# Patient Record
Sex: Female | Born: 1970 | Race: White | Hispanic: No | Marital: Single | Smoking: Current every day smoker
Health system: Southern US, Community
[De-identification: ages and names within clinical notes are randomized; demographics above are authoritative.]

## PROBLEM LIST (undated history)

## (undated) DIAGNOSIS — E271 Primary adrenocortical insufficiency: Secondary | ICD-10-CM

## (undated) DIAGNOSIS — E119 Type 2 diabetes mellitus without complications: Secondary | ICD-10-CM

## (undated) DIAGNOSIS — I10 Essential (primary) hypertension: Secondary | ICD-10-CM

## (undated) DIAGNOSIS — J45909 Unspecified asthma, uncomplicated: Secondary | ICD-10-CM

## (undated) HISTORY — PX: OTHER SURGICAL HISTORY: SHX169

## (undated) HISTORY — PX: BACK SURGERY: SHX140

---

## 1998-05-10 ENCOUNTER — Other Ambulatory Visit: Admission: RE | Admit: 1998-05-10 | Discharge: 1998-05-10 | Payer: Self-pay | Admitting: Obstetrics and Gynecology

## 1999-11-09 ENCOUNTER — Emergency Department (HOSPITAL_COMMUNITY): Admission: EM | Admit: 1999-11-09 | Discharge: 1999-11-09 | Payer: Self-pay | Admitting: Emergency Medicine

## 2001-09-23 ENCOUNTER — Emergency Department (HOSPITAL_COMMUNITY): Admission: EM | Admit: 2001-09-23 | Discharge: 2001-09-23 | Payer: Self-pay | Admitting: Emergency Medicine

## 2001-09-23 ENCOUNTER — Encounter: Payer: Self-pay | Admitting: Emergency Medicine

## 2002-02-16 ENCOUNTER — Emergency Department (HOSPITAL_COMMUNITY): Admission: EM | Admit: 2002-02-16 | Discharge: 2002-02-16 | Payer: Self-pay | Admitting: Emergency Medicine

## 2002-02-16 ENCOUNTER — Encounter: Payer: Self-pay | Admitting: Emergency Medicine

## 2002-03-06 ENCOUNTER — Emergency Department (HOSPITAL_COMMUNITY): Admission: EM | Admit: 2002-03-06 | Discharge: 2002-03-06 | Payer: Self-pay | Admitting: Emergency Medicine

## 2002-03-08 ENCOUNTER — Inpatient Hospital Stay (HOSPITAL_COMMUNITY): Admission: EM | Admit: 2002-03-08 | Discharge: 2002-03-15 | Payer: Self-pay | Admitting: Emergency Medicine

## 2002-03-08 ENCOUNTER — Encounter (HOSPITAL_BASED_OUTPATIENT_CLINIC_OR_DEPARTMENT_OTHER): Payer: Self-pay | Admitting: Internal Medicine

## 2002-03-09 ENCOUNTER — Encounter (HOSPITAL_BASED_OUTPATIENT_CLINIC_OR_DEPARTMENT_OTHER): Payer: Self-pay | Admitting: Internal Medicine

## 2002-03-10 ENCOUNTER — Encounter: Payer: Self-pay | Admitting: Specialist

## 2002-03-11 ENCOUNTER — Encounter (HOSPITAL_BASED_OUTPATIENT_CLINIC_OR_DEPARTMENT_OTHER): Payer: Self-pay | Admitting: Internal Medicine

## 2002-03-27 ENCOUNTER — Ambulatory Visit: Admission: RE | Admit: 2002-03-27 | Discharge: 2002-03-27 | Payer: Self-pay | Admitting: Specialist

## 2002-12-21 ENCOUNTER — Ambulatory Visit (HOSPITAL_COMMUNITY): Admission: RE | Admit: 2002-12-21 | Discharge: 2002-12-21 | Payer: Self-pay | Admitting: Specialist

## 2002-12-21 ENCOUNTER — Encounter: Payer: Self-pay | Admitting: Specialist

## 2005-08-22 ENCOUNTER — Encounter (INDEPENDENT_AMBULATORY_CARE_PROVIDER_SITE_OTHER): Payer: Self-pay | Admitting: Specialist

## 2005-08-22 ENCOUNTER — Encounter: Admission: RE | Admit: 2005-08-22 | Discharge: 2005-08-22 | Payer: Self-pay | Admitting: Family Medicine

## 2006-10-15 ENCOUNTER — Emergency Department (HOSPITAL_COMMUNITY): Admission: EM | Admit: 2006-10-15 | Discharge: 2006-10-15 | Payer: Self-pay | Admitting: Emergency Medicine

## 2007-06-11 ENCOUNTER — Emergency Department (HOSPITAL_COMMUNITY): Admission: EM | Admit: 2007-06-11 | Discharge: 2007-06-11 | Payer: Self-pay | Admitting: Emergency Medicine

## 2007-09-30 ENCOUNTER — Emergency Department (HOSPITAL_COMMUNITY): Admission: EM | Admit: 2007-09-30 | Discharge: 2007-09-30 | Payer: Self-pay | Admitting: Emergency Medicine

## 2007-10-23 ENCOUNTER — Ambulatory Visit (HOSPITAL_COMMUNITY): Admission: RE | Admit: 2007-10-23 | Discharge: 2007-10-24 | Payer: Self-pay | Admitting: Neurosurgery

## 2008-05-05 ENCOUNTER — Ambulatory Visit: Payer: Self-pay | Admitting: Internal Medicine

## 2008-05-06 ENCOUNTER — Inpatient Hospital Stay (HOSPITAL_COMMUNITY): Admission: EM | Admit: 2008-05-06 | Discharge: 2008-05-08 | Payer: Self-pay | Admitting: Emergency Medicine

## 2008-05-07 ENCOUNTER — Encounter (INDEPENDENT_AMBULATORY_CARE_PROVIDER_SITE_OTHER): Payer: Self-pay | Admitting: Internal Medicine

## 2008-05-12 ENCOUNTER — Observation Stay (HOSPITAL_COMMUNITY): Admission: EM | Admit: 2008-05-12 | Discharge: 2008-05-14 | Payer: Self-pay | Admitting: Emergency Medicine

## 2008-08-24 ENCOUNTER — Emergency Department (HOSPITAL_COMMUNITY): Admission: EM | Admit: 2008-08-24 | Discharge: 2008-08-24 | Payer: Self-pay | Admitting: Family Medicine

## 2008-10-14 ENCOUNTER — Emergency Department (HOSPITAL_COMMUNITY): Admission: EM | Admit: 2008-10-14 | Discharge: 2008-10-14 | Payer: Self-pay | Admitting: Emergency Medicine

## 2008-10-25 ENCOUNTER — Emergency Department (HOSPITAL_COMMUNITY): Admission: EM | Admit: 2008-10-25 | Discharge: 2008-10-25 | Payer: Self-pay | Admitting: Emergency Medicine

## 2009-01-19 ENCOUNTER — Emergency Department (HOSPITAL_COMMUNITY): Admission: EM | Admit: 2009-01-19 | Discharge: 2009-01-19 | Payer: Self-pay | Admitting: Family Medicine

## 2009-03-02 ENCOUNTER — Emergency Department (HOSPITAL_COMMUNITY): Admission: EM | Admit: 2009-03-02 | Discharge: 2009-03-02 | Payer: Self-pay | Admitting: Family Medicine

## 2009-03-20 ENCOUNTER — Emergency Department (HOSPITAL_COMMUNITY): Admission: EM | Admit: 2009-03-20 | Discharge: 2009-03-20 | Payer: Self-pay | Admitting: Emergency Medicine

## 2009-05-05 ENCOUNTER — Emergency Department (HOSPITAL_COMMUNITY): Admission: EM | Admit: 2009-05-05 | Discharge: 2009-05-05 | Payer: Self-pay | Admitting: Family Medicine

## 2010-01-03 ENCOUNTER — Encounter: Admission: RE | Admit: 2010-01-03 | Discharge: 2010-01-03 | Payer: Self-pay | Admitting: Obstetrics & Gynecology

## 2010-02-24 ENCOUNTER — Emergency Department (HOSPITAL_COMMUNITY)
Admission: EM | Admit: 2010-02-24 | Discharge: 2010-02-24 | Payer: Self-pay | Source: Home / Self Care | Admitting: Emergency Medicine

## 2010-03-26 ENCOUNTER — Encounter: Payer: Self-pay | Admitting: Family Medicine

## 2010-05-15 LAB — DIFFERENTIAL
Basophils Relative: 0 % (ref 0–1)
Eosinophils Absolute: 0.1 10*3/uL (ref 0.0–0.7)
Eosinophils Relative: 1 % (ref 0–5)
Monocytes Absolute: 0.6 10*3/uL (ref 0.1–1.0)
Monocytes Relative: 5 % (ref 3–12)

## 2010-05-15 LAB — URINALYSIS, ROUTINE W REFLEX MICROSCOPIC
Bilirubin Urine: NEGATIVE
Hgb urine dipstick: NEGATIVE
Protein, ur: NEGATIVE mg/dL
Specific Gravity, Urine: 1.006 (ref 1.005–1.030)
Urobilinogen, UA: 0.2 mg/dL (ref 0.0–1.0)

## 2010-05-15 LAB — POCT PREGNANCY, URINE: Preg Test, Ur: NEGATIVE

## 2010-05-15 LAB — BASIC METABOLIC PANEL
CO2: 27 mEq/L (ref 19–32)
Glucose, Bld: 124 mg/dL — ABNORMAL HIGH (ref 70–99)
Potassium: 3.3 mEq/L — ABNORMAL LOW (ref 3.5–5.1)
Sodium: 140 mEq/L (ref 135–145)

## 2010-05-15 LAB — CBC
HCT: 37.6 % (ref 36.0–46.0)
Hemoglobin: 12.8 g/dL (ref 12.0–15.0)
MCH: 30 pg (ref 26.0–34.0)
MCHC: 34 g/dL (ref 30.0–36.0)
MCV: 88.1 fL (ref 78.0–100.0)

## 2010-06-07 LAB — POCT I-STAT, CHEM 8
BUN: <3 mg/dL — ABNORMAL LOW (ref 6–23)
Calcium, Ion: 1.16 mmol/L (ref 1.12–1.32)
Chloride: 106 mEq/L (ref 96–112)

## 2010-06-10 LAB — DIFFERENTIAL
Eosinophils Absolute: 0 10*3/uL (ref 0.0–0.7)
Eosinophils Relative: 0 % (ref 0–5)
Lymphs Abs: 1.8 10*3/uL (ref 0.7–4.0)
Monocytes Absolute: 0.5 10*3/uL (ref 0.1–1.0)
Monocytes Relative: 6 % (ref 3–12)

## 2010-06-10 LAB — BASIC METABOLIC PANEL
BUN: 8 mg/dL (ref 6–23)
Chloride: 110 mEq/L (ref 96–112)
GFR calc Af Amer: >60 mL/min (ref >60)
Potassium: 3.7 mEq/L (ref 3.5–5.1)

## 2010-06-10 LAB — CBC
HCT: 44.1 % (ref 36.0–46.0)
Hemoglobin: 15 g/dL (ref 12.0–15.0)
MCV: 95.1 fL (ref 78.0–100.0)
RBC: 4.64 MIL/uL (ref 3.87–5.11)
WBC: 8.1 10*3/uL (ref 4.0–10.5)

## 2010-06-10 LAB — POCT RAPID STREP A (OFFICE): Streptococcus, Group A Screen (Direct): NEGATIVE

## 2010-06-12 LAB — POCT I-STAT, CHEM 8
Chloride: 104 mEq/L (ref 96–112)
Creatinine, Ser: 0.8 mg/dL (ref 0.4–1.2)
Glucose, Bld: 89 mg/dL (ref 70–99)
HCT: 48 % — ABNORMAL HIGH (ref 36.0–46.0)
Potassium: 3.7 mEq/L (ref 3.5–5.1)

## 2010-06-15 LAB — DIFFERENTIAL
Basophils Absolute: 0 10*3/uL (ref 0.0–0.1)
Basophils Relative: 1 % (ref 0–1)
Basophils Relative: 2 % — ABNORMAL HIGH (ref 0–1)
Eosinophils Absolute: 0.1 10*3/uL (ref 0.0–0.7)
Eosinophils Absolute: 0.1 10*3/uL (ref 0.0–0.7)
Eosinophils Relative: 1 % (ref 0–5)
Lymphs Abs: 2.7 10*3/uL (ref 0.7–4.0)
Lymphs Abs: 3.1 10*3/uL (ref 0.7–4.0)
Monocytes Absolute: 0.6 10*3/uL (ref 0.1–1.0)
Monocytes Absolute: 0.6 10*3/uL (ref 0.1–1.0)
Monocytes Relative: 8 % (ref 3–12)
Monocytes Relative: 8 % (ref 3–12)
Neutro Abs: 4.7 10*3/uL (ref 1.7–7.7)
Neutrophils Relative %: 62 % (ref 43–77)

## 2010-06-15 LAB — URINALYSIS, ROUTINE W REFLEX MICROSCOPIC
Hgb urine dipstick: NEGATIVE
Ketones, ur: NEGATIVE mg/dL
Nitrite: NEGATIVE
Protein, ur: NEGATIVE mg/dL
Specific Gravity, Urine: 1.018 (ref 1.005–1.030)
Urobilinogen, UA: 0.2 mg/dL (ref 0.0–1.0)
Urobilinogen, UA: 1 mg/dL (ref 0.0–1.0)

## 2010-06-15 LAB — VITAMIN B12: Vitamin B-12: 478 pg/mL (ref 211–911)

## 2010-06-15 LAB — COMPREHENSIVE METABOLIC PANEL
ALT: 20 U/L (ref 0–35)
ALT: 26 U/L (ref 0–35)
AST: 13 U/L (ref 0–37)
AST: 22 U/L (ref 0–37)
Albumin: 3.2 g/dL — ABNORMAL LOW (ref 3.5–5.2)
Albumin: 3.8 g/dL (ref 3.5–5.2)
Alkaline Phosphatase: 49 U/L (ref 39–117)
Alkaline Phosphatase: 67 U/L (ref 39–117)
BUN: 7 mg/dL (ref 6–23)
CO2: 24 mEq/L (ref 19–32)
Calcium: 9.2 mg/dL (ref 8.4–10.5)
Chloride: 104 mEq/L (ref 96–112)
Chloride: 108 mEq/L (ref 96–112)
GFR calc Af Amer: >60 mL/min (ref >60)
GFR calc Af Amer: >60 mL/min (ref >60)
GFR calc Af Amer: >60 mL/min (ref >60)
Glucose, Bld: 102 mg/dL — ABNORMAL HIGH (ref 70–99)
Potassium: 3.2 mEq/L — ABNORMAL LOW (ref 3.5–5.1)
Potassium: 3.9 mEq/L (ref 3.5–5.1)
Potassium: 4 mEq/L (ref 3.5–5.1)
Sodium: 136 mEq/L (ref 135–145)
Sodium: 141 mEq/L (ref 135–145)
Sodium: 141 mEq/L (ref 135–145)
Total Bilirubin: 1.6 mg/dL — ABNORMAL HIGH (ref 0.3–1.2)
Total Protein: 5.1 g/dL — ABNORMAL LOW (ref 6.0–8.3)
Total Protein: 6.5 g/dL (ref 6.0–8.3)
Total Protein: 6.6 g/dL (ref 6.0–8.3)

## 2010-06-15 LAB — LIPID PANEL
Cholesterol: 155 mg/dL (ref 0–200)
LDL Cholesterol: 98 mg/dL (ref 0–99)
Total CHOL/HDL Ratio: 3.8 RATIO

## 2010-06-15 LAB — URINE MICROSCOPIC-ADD ON

## 2010-06-15 LAB — POCT I-STAT, CHEM 8
BUN: 8 mg/dL (ref 6–23)
Calcium, Ion: 1.17 mmol/L (ref 1.12–1.32)
Chloride: 104 mEq/L (ref 96–112)
Chloride: 110 mEq/L (ref 96–112)
Glucose, Bld: 85 mg/dL (ref 70–99)
Glucose, Bld: 89 mg/dL (ref 70–99)
HCT: 51 % — ABNORMAL HIGH (ref 36.0–46.0)
Potassium: 3.2 mEq/L — ABNORMAL LOW (ref 3.5–5.1)
Potassium: 4.3 mEq/L (ref 3.5–5.1)

## 2010-06-15 LAB — CBC
HCT: 43 % (ref 36.0–46.0)
HCT: 45 % (ref 36.0–46.0)
Hemoglobin: 13.6 g/dL (ref 12.0–15.0)
Hemoglobin: 13.6 g/dL (ref 12.0–15.0)
Hemoglobin: 14.2 g/dL (ref 12.0–15.0)
Hemoglobin: 14.9 g/dL (ref 12.0–15.0)
MCHC: 33.3 g/dL (ref 30.0–36.0)
MCV: 90.4 fL (ref 78.0–100.0)
MCV: 90.9 fL (ref 78.0–100.0)
Platelets: 228 10*3/uL (ref 150–400)
Platelets: 262 10*3/uL (ref 150–400)
Platelets: 313 10*3/uL (ref 150–400)
Platelets: 334 10*3/uL (ref 150–400)
RBC: 4.48 MIL/uL (ref 3.87–5.11)
RBC: 4.95 MIL/uL (ref 3.87–5.11)
RBC: 4.98 MIL/uL (ref 3.87–5.11)
RDW: 13.4 % (ref 11.5–15.5)
RDW: 13.6 % (ref 11.5–15.5)
RDW: 13.9 % (ref 11.5–15.5)
WBC: 7.7 10*3/uL (ref 4.0–10.5)
WBC: 8.4 10*3/uL (ref 4.0–10.5)
WBC: 9.4 10*3/uL (ref 4.0–10.5)

## 2010-06-15 LAB — HOMOCYSTEINE: Homocysteine: 11 umol/L (ref 4.0–15.4)

## 2010-06-15 LAB — TSH: TSH: 1.684 u[IU]/mL (ref 0.350–4.500)

## 2010-06-15 LAB — GLUCOSE, CAPILLARY
Glucose-Capillary: 102 mg/dL — ABNORMAL HIGH (ref 70–99)
Glucose-Capillary: 108 mg/dL — ABNORMAL HIGH (ref 70–99)
Glucose-Capillary: 110 mg/dL — ABNORMAL HIGH (ref 70–99)
Glucose-Capillary: 125 mg/dL — ABNORMAL HIGH (ref 70–99)
Glucose-Capillary: 137 mg/dL — ABNORMAL HIGH (ref 70–99)
Glucose-Capillary: 49 mg/dL — ABNORMAL LOW (ref 70–99)
Glucose-Capillary: 95 mg/dL (ref 70–99)
Glucose-Capillary: 98 mg/dL (ref 70–99)
Glucose-Capillary: 98 mg/dL (ref 70–99)

## 2010-06-15 LAB — BASIC METABOLIC PANEL
CO2: 25 mEq/L (ref 19–32)
Chloride: 108 mEq/L (ref 96–112)
Chloride: 110 mEq/L (ref 96–112)
GFR calc Af Amer: >60 mL/min (ref >60)
GFR calc non Af Amer: >60 mL/min (ref >60)
Glucose, Bld: 95 mg/dL (ref 70–99)
Glucose, Bld: 97 mg/dL (ref 70–99)
Potassium: 3.9 mEq/L (ref 3.5–5.1)
Sodium: 138 mEq/L (ref 135–145)
Sodium: 139 mEq/L (ref 135–145)

## 2010-06-15 LAB — HEMOGLOBIN A1C
Hgb A1c MFr Bld: 5.5 % (ref 4.6–6.1)
Mean Plasma Glucose: 111 mg/dL

## 2010-06-15 LAB — POCT CARDIAC MARKERS
CKMB, poc: <1 ng/mL — ABNORMAL LOW (ref 1.0–8.0)
CKMB, poc: <1 ng/mL — ABNORMAL LOW (ref 1.0–8.0)
Myoglobin, poc: 34.3 ng/mL (ref 12–200)
Myoglobin, poc: 41.7 ng/mL (ref 12–200)
Troponin i, poc: <0.05 ng/mL (ref 0.00–0.09)
Troponin i, poc: <0.05 ng/mL (ref 0.00–0.09)

## 2010-06-15 LAB — CORTISOL: Cortisol, Plasma: 12.7 ug/dL

## 2010-06-15 LAB — MAGNESIUM: Magnesium: 2.1 mg/dL (ref 1.5–2.5)

## 2010-06-15 LAB — ALDOSTERONE + RENIN ACTIVITY W/ RATIO

## 2010-06-15 LAB — CARDIAC PANEL(CRET KIN+CKTOT+MB+TROPI)
Relative Index: INVALID (ref 0.0–2.5)
Relative Index: INVALID (ref 0.0–2.5)
Total CK: 47 U/L (ref 7–177)
Total CK: 79 U/L (ref 7–177)
Troponin I: 0.01 ng/mL (ref 0.00–0.06)
Troponin I: 0.03 ng/mL (ref 0.00–0.06)

## 2010-06-15 LAB — RAPID URINE DRUG SCREEN, HOSP PERFORMED
Amphetamines: NOT DETECTED
Barbiturates: NOT DETECTED
Benzodiazepines: NOT DETECTED
Tetrahydrocannabinol: NOT DETECTED

## 2010-06-15 LAB — URINE CULTURE: Culture: NO GROWTH

## 2010-06-15 LAB — T4, FREE: Free T4: 1.08 ng/dL (ref 0.89–1.80)

## 2010-06-15 LAB — PROTIME-INR: Prothrombin Time: 12.8 seconds (ref 11.6–15.2)

## 2010-07-18 NOTE — H&P (Signed)
Kristin Hickman, Kristin Hickman                ACCOUNT NO.:  [id number]   MEDICAL RECORD NO.:  [id number]          PATIENT TYPE:  INP   LOCATION:  1429                         FACILITY:  [facility]   PHYSICIAN:  Vania Rea, M.D. DATE OF BIRTH:  02/24/1971   DATE OF ADMISSION:  05/05/2008  DATE OF DISCHARGE:                              HISTORY & PHYSICAL   PRIMARY CARE PHYSICIAN:  Unassigned date.   CHIEF COMPLAINT:  Dizziness and lightheaded when standing, progressively  worse over the past 2 days.   HISTORY OF PRESENT ILLNESS:  This is a 40 year old Caucasian lady with a  history of hypertension, noncompliant with her Atenolol for the past  month, who drinks about three [employer] drinks per week, but  otherwise denies any other over-the-counter medications.  Reports that  she had been having unexplained thirst and drinking plenty of water for  the past month.  Says she drinks at least 10 cups of water per day and  she figured that is why she is passing so much urine.  In any case,  yesterday, the patient started noting that she was feeling somewhat  lightheaded and did not pay any attention.  But today, the  lightheadedness and dizziness was so severe whenever she stood up to  walk that she had to leave work.  At home, she checked her blood  pressure and it was 150/110 and she was advised to come into the  emergency room to be evaluated.  In the emergency room, the patient  describes her symptoms and also mentioned that the fingers of her left  hand were sort of numb.  The patient received sublingual nitroglycerin  without any benefit.  She also had CBG check which turned out to be 49,  and she got some orange juice to drink which brought her sugar up, but  the lightheadedness persisted.  Since then, the patient has remained  lightheaded on attempting to get to the bathroom.  She had 1000 mL of  normal saline.  Hospitalist service was called to assist with management  for uncontrolled  hypertension and unexplained hypoglycemia.   The patient has no history of diabetes.  There is no history of vomiting  nor diarrhea.  She denies any diuretic use.   PAST MEDICAL HISTORY:  1. Hypertension.  2. Chronic back pain status post back surgery.  3. 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   MEDICATIONS:  None.   ALLERGIES:  CODEINE CAUSES ANAPHYLAXIS.   SOCIAL HISTORY:  She smokes half pack of cigarettes per day.  She cut  down from over a pack.  She denies alcohol or illicit drug use.  She  works as a Engineer, petroleum and exercises every day.  She does not  eat in a healthy way.  She had a hot dog for lunch, and that was all she  had prior to coming to the emergency room at 6:15 this evening.   FAMILY HISTORY:  Significant for father with diabetes who died of  coronary artery disease.  Mother with cerebral palsy and hypertension.  Siblings are  healthy.   REVIEW OF SYSTEMS:  The patient says she sleeps fairly well.  She does  have dizziness, nausea as noted in the H&P.  There is no headache.  There is no chest pains or shortness of breath.  There is no dysuria nor  hematuria.  She has no black or bloody stool.  She has no joint pains.  She is not losing weight.  She has no focal weakness.  She has no  history of endocrine problems.   PHYSICAL EXAMINATION:  GENERAL:  Obese and somewhat anxious Caucasian  lady reclining in the stretcher.  VITALS:  Temperature is 97.8, pulse 84, respirations 16.  Her blood  pressure reclining is 142/94, standing it is 165/110.  Her pulse pretty  much remains unchanged at 81 from lying to standing.  She became very  anxious and tremulous during procedure.  Pupils are round equal and  reactive.  Mucous membranes pink and anicteric.  No cervical  lymphadenopathy or thyromegaly.  No jugular venous distention.  No  carotid bruit.  Chest is clear to auscultation bilaterally.  Cardiovascular system regular rhythm  without murmur.  ABDOMEN:  Obese, soft and nontender.  EXTREMITIES:  Without edema.  She has 2+ dorsalis pedis pulses  bilaterally.  CENTRAL NERVOUS SYSTEM:  Cranial nerves II-XII are grossly intact and  she has no focal neurologic deficit.   LABORATORY DATA:  CBC has not been done.  CBG was done on admission,  showed a fingerstick blood sugar of 49.  She received a cup of orange  juice and her I-stat shows a glucose of 85.  Sodium of 139, potassium  3.2, chloride 104, CO2 26, BUN 8, creatinine 1.0.  Cardiac markers  completely normal with undetectable CK-MB, undetectable troponins and  myoglobin 42.  Pregnancy test is negative and urinalysis shows extremely  diluted urine was a specific gravity of 1.003, pH of 7, negative for  nitrites or leukocyte esterase, negative for protein, no imaging studies  have been done.  The EKG looks like normal sinus rhythm.  ST-segment is  somewhat elevated in 2, 3 and aVF.   ASSESSMENT:  1. Orthostatic hypertension associated with dizziness and nausea in a      young, obese, Caucasian lady noncompliant with her beta blockers  2. Anxiety disorder.  3. Unexplained polyuria.  4. Hypokalemia associated with polyuria.  5. Transient mild hypoglycemia.   PLAN:  Will admit this lady for IV fluid hydration and repletion of her  potassium, monitor her cardiac enzymes and will go ahead and restart her  atenolol.  Will monitor her I's and O's very strictly and check urine  hours osmolality compared to serum osmolality.  Find out if the patient  has a problem with concentrating urine.   The patient is displaying extreme anxiety in the emergency room, and it  may be that the patient is having some degree of psychogenic polydipsia  also.  Other plans as per orders.      Vania Rea, M.D.  Electronically Signed     LC/MEDQ  D:  05/06/2008  T:  05/06/2008  Job:  [id number]

## 2010-07-18 NOTE — Consult Note (Signed)
Kristin Hickman, Kristin Hickman                ACCOUNT NO.:  [id number]   MEDICAL RECORD NO.:  [id number]          PATIENT TYPE:  INP   LOCATION:  1429                         FACILITY:  [facility]   PHYSICIAN:  Tera Mater. Evlyn Kanner, M.D. DATE OF BIRTH:  1970-03-18   DATE OF CONSULTATION:  05/08/2008  DATE OF DISCHARGE:  05/08/2008                                 CONSULTATION   Consultation is requested for evaluation of possible diabetes insipidus,  adrenal insufficiency and other causes of hypotension.  Ms. Borys is 40-  year-old white female admitted by the [facility] Team on the 4th.  She  presented after having an episode at work where she had a narrowing of  her vision, a lightheadedness and a near syncopal type episode.  The  patient has an interesting history of, in the last 3 months, substantial  weight loss that is only partially intentional.  She has gone from a  size 18 to a size 12 pant size, she has not really weighed herself.  She  does note intermittent episodes of unsteadiness and lightheadedness.  She does not describe a true vertigo.  Interestingly, she does get a  tunnel vision type of problem where the lateral visual fields  bilaterally are said to be darkened and wavy.  She does not ever get any  headache with this.  There was also a compounding factor that she has  been recently offered atenolol and her blood pressure was up when she  came in.  In addition, her blood sugar was somewhat low at 49 by a CBG  check.  An excellent and extensive evaluation has been done by the  [facility] team.  They have looked carefully for a variety of problems.  The patient reports significant polydipsia and polyuria.  She gets up  some during the night to eat and drink.  Workup of that will be detailed  below.  The patient denies any breathing trouble, no chest pain, she has  no joint complaints at the present time.   LAB REVIEW:  TSH was perfectly fine at 1.684, a hemoglobin A1c was  normal of  5.5%.  A lipid panel was normal except for a slightly low HDL  at 41.  Cardiac panels were negative for any cardiac problems.  A urine  drug screen was negative.  A coincident urine osmolality and serum  osmolality done on the 4th with the serum level of 282 mOsm/kg and her  urine level nearly the same time was 283 mOsm/kg.  Additionally, morning  cortisol on the 5th was 20.7, B12 and folate were normal at 478 and 7.4,  respectively, a magnesium was normal at 2.1.  Chemistries yesterday  morning:  Sodium 139, potassium 3.9, chloride 108, CO2 25, BUN 6,  creatinine 0.88, glucose of 95.  A white count was 7400, hemoglobin  14.2, MCV 90.9, platelets 260,000.  Free T4 was 1.08.  Prior random  cortisol at 11 a.m. was 12.7.  Urine pregnancy test was negative.  Radiology testing included an MRI of the head and showed normal MRI of  the brain, normal intracranial  MR angiography, large and medium size  vessels.  A chest x-ray was mild chronic bronchitic changes.   PAST MEDICAL HISTORY:  1. Hypertension.  2. Chronic back pain with surgery.  3. Removal of the right submandibular salivary gland.   SOCIAL HISTORY:  Reveals she is a 1/2-pack-a-day smoker.  She is a  Engineer, petroleum.   FAMILY HISTORY:  Positive for diabetes and coronary disease in father.  Mother had cerebral palsy and hypertension.   PAST PROBLEMS:  1. She was seen in 2009 also with headache problems, twice, it looks      like, with headache problems.  2. She also had a benign breast disease biopsy in 2007.   MEDICATIONS PRIOR TO COMING IN:  She was supposed to be on the atenolol  but had been off that.   CODEINE caused anaphylaxis for her.   On exam here today at the hospital, blood pressure was 117/67, pulse 70,  respiration 18, temperature 97.6, O2 sats 100% on room air.  Blood sugar  was 110 mid-day, 98 this morning.  We have a fairly healthy-appearing  white female sitting up, in no distress.  Sclerae anicteric,  there is no  lid lag or exophthalmos.  Extraocular movements are intact without  nystagmus.  Oral mucous membranes are moist without lesions.  NECK:  Supple without JVD, bruits or thyromegaly.  No acanthosis or  pigmentary changes are noted.  LUNGS:  Clear without wheezes, rales or rhonchi.  HEART:  Regular without murmurs, rubs or gallops.  ABDOMEN:  Soft, nontender with no masses.  EXTREMITIES:  Strong distal pulses with no edema.  The patient is awake, alert.  Her mentation is good.  Her speech is  clear.  No resting tremor is present.  Speech is fluent, there is no  evidence of hallucination or delusion.   SUMMARY:  We have a complex 40 year old white female presenting with  dizziness and presyncope type episodes.  An interesting historical fact  is of these tunnel vision type changes with visual aura.  There is a  condition known as amigrainous migraines where a person has the vascular  manifestations of migraine type headaches with vascular spasm then  dilation but without headaches.  I do have a suspicion that this could  be there and it could have been that the beta blocker she was taking was  partially blocking these episodes.  With regard to her weight loss, a  morning cortisol of over 20 essentially rules out a cortisol deficiency.  The fact that her electrolytes are also fine and there is no pigmentary  changes would make Addison's disease unlikely.  Also, the MRI of the  brain does not show any pituitary changes.  Her thyroid function clearly  is normal at the present time and no evidence of any thyroid eye disease  or goiter is present.  I do not think any more thyroid follow-up is  required.  With regard to her polydipsia and polyuria, although this is  always a difficult problem to diagnose, at the present time the workup  you have done is excellent.  It appears that with a normal serum  osmolality and dilute urine, along with the BUN and creatinine where  they were,  that this essentially rules out significant diabetes  insipidus.  She could have a partial problem that she compensates for  but I suspect this is polydipsia driving polyuria rather than the other  way around.  A true water deprivation test I  do not think is required at  the present time.  With regard to her slightly low sugar when she came  in, I suspect this represents some reactive hypoglycemia.  Patients that  have lost substantial weight in a short period of time can have somewhat  of an overactive pancreatic response, especially to high sugar intake  such as her Monster energy drink that she consumes.  The fact this  occurred in temporal relation to this is suggestive of such a problem.  Her A1c is normal and her sugars have been fine while here with no  evidence of significant diabetes problems.  She does have a positive  family history and is at some risk, but does not show many of the signs  of true insulin resistance.  Specifically, she does not have the high  triglycerides that I would expect or any acanthosis or necessary body  habitus changes for this.  To treat this, I think time alone will be the  best course of action with a diminished amount of free carbohydrates but  instead always mixing them with fat and protein.  We could consider a  true diet for reactive hypoglycemia or we could just warn her to reduce  the carbohydrate intake.  With regard other unusual causes of problem  such as this, of course neurogenic syncope still is on list;  she does  not really have those symptoms.  I think the best course of action is to  consider starting her back on a beta blocker as prophylaxis for these  neurologic events, consider workup of her dizziness with vestibular  exercises, if needed, and from an endocrine standpoint I think we can  say that she is pretty clear of any clear problems at the present time.  All of this  was discussed in detail with the patient who seemed quite  pleased not to  have any of these conditions.  I will not need to see her back in follow-  up based on these symptoms.  Thank you for the opportunity to  participate in the care of this patient.           ______________________________  Tera Mater. Evlyn Kanner, M.D.     SAS/MEDQ  D:  05/08/2008  T:  05/09/2008  Job:  [id number]

## 2010-07-18 NOTE — Discharge Summary (Signed)
Kristin Hickman, Kristin Hickman                ACCOUNT NO.:  [id number]   MEDICAL RECORD NO.:  [id number]          PATIENT TYPE:  INP   LOCATION:  1429                         [facility]AC[facility]   PHYSICIAN:  Monte Fantasia, MD  DATE OF BIRTH:  12/26/1970   DATE OF ADMISSION:  05/05/2008  DATE OF DISCHARGE:  05/08/2008                               DISCHARGE SUMMARY   PRIMARY CARE PHYSICIAN:  Unassigned.   DISCHARGE DIAGNOSES:  1. Orthostatic hypotension on admission associated with dizziness.  2. Anxiety disorder.  3. Psychogenic polydipsia.  4. Hypokalemia which has resolved.  5. Reactive hypoglycemia.  6. 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   MEDICATIONS ON DISCHARGE:  Atenolol 12.5 mg p.o. daily.   HOSPITAL COURSE:  A 40 year old Caucasian lady patient with a history of  hypertension was admitted to [facility] with the complaints of  dizziness and lightheadedness on standing.  The patient has a recent  history of loss of weight from size 18 to size 12 in the past 6-7 months  which was intentional.  The patient had been taking Atenolol for her  hypertension prior to admission.  However, she had been noncompliant for  the past few weeks.  On admission, the patient was orthostatic and was  low on her sugars with a CBG of 49.  The patient was given IV fluid  hydration for the same; however, her dizziness persisted and hence was  admitted.  The patient did have a full workup in view of her dizziness.  Her serum cortisol level, random and morning cortisol levels have been  normal.  Her TSH was normal.  Her urine osmolality was borderline though  and her serum osmolality was normal.  The patient also had an MRI/MRA of  the brain to rule out posterior cerebral stroke, which was normal.  The  patient had a 2-D echo, which was normal with an EF of 60-65% and normal  systolic function.  The patient responded well with the fluid  resuscitation and all also had an evaluation with the  endocrinologist,  Dr. Evlyn Kanner, for the same and, as per the recommendation, the patient had  no adrenal or thyroid disease or diabetes insipidus, may be possibly  secondary to hypoglycemia and could be secondary to reactive  hypoglycemia.  The patient at present is asymptomatic and medically  stable to be discharged.   LAB INVESTIGATION:  Done during the stay in the hospital:  Total WBC  7.4, hemoglobin 14.2, hematocrit 43.0, platelets of 216.  Sodium 139,  potassium 3.9, chloride 108, bicarb 25, glucose 95, BUN 6, creatinine  0.8, calcium 8.9, magnesium 2.1.  Hemoglobin A1c is 5.5, mean plasma  glucose is 111, serum osmolarity is 282.  Cardiac enzymes x3 sets have  been negative.  Lipid profile:  Total cholesterol 155, triglycerides 82,  HDL 41, LDL 98.  Free T4 is 1.08, TSH 1.684.  Vitamin B12 of 478, folate  is 7.4, random cortisol is 12.7 with a.m. cortisol levels of 20.7. Drug  screen was negative.  Urine osmolality was 283.   RADIOLOGICAL INVESTIGATIONS:  Done during the stay in  the hospital:  Chest x-ray done on May 06, 2008: Impression of mild chronic bronchitic  changes.  MRI of the brain done of done on March showed 2010:  Impression of normal MRI of brain.  MRA of the head:  Normal  intracranial MRA of large and medium-sized vessels.  MRA of the neck:  Impression of normal MR angiogram the of the neck vessels.  A 2-D echo  done on May 07, 2008:  Overall left ventricular systolic function was  normal left, ventricular ejection fraction was 55-65%.   DISPOSITION:  The patient is medically stable to be discharged and can  be discharged home.  The patient is recommended to have a primary care  physician and to follow up with the primary care physician as an  outpatient.      Monte Fantasia, MD  Electronically Signed     MP/MEDQ  D:  05/08/2008  T:  05/09/2008  Job:  [id number]

## 2010-07-18 NOTE — Discharge Summary (Signed)
NAMESHATONIA, HOOTS                ACCOUNT NO.:  [id number]   MEDICAL RECORD NO.:  [id number]          PATIENT TYPE:  OBV   LOCATION:  0157                         [facility]ACILITY:  [facility]   PHYSICIAN:  Herbie Saxon, MDDATE OF BIRTH:  1970/10/06   DATE OF ADMISSION:  05/12/2008  DATE OF DISCHARGE:  05/14/2008                               DISCHARGE SUMMARY   DISCHARGE DIAGNOSES:  1. Dizziness, vertigo symptoms, improved.  2. Hypertension, stable.  3. Chronic back pain.  4. Morbid obesity.  5. History of anxiety disorder.   CLINICAL DATA:  Radiology:  The renal ultrasound scan of April 16, 2008 was unremarkable.   HOSPITAL COURSE:  This 40 year old Caucasian lady, who was discharged  from [facility] on May 08, 2008, after being worked up for  dizziness, had an endocrinology evaluation at that admission by Dr.  Evlyn Kanner with work up being negative.  She re-presented to the emergency  room complaining of dizziness on standing.  A plasma Renin aldosterone  test was done which was borderline low ratio.  The patient was initially  started on a trial of Florinef but this is being discontinued.  The  trend of the patient's blood pressure is on an upward swing when she  assumes the erect position.  She had a prescription for Atenolol 25 mg  daily on the last admission but the patient has not been using it.   CONDITION ON DISCHARGE:  Stable.   DIET:  Diet will be 2 gram sodium, heart-healthy, low cholesterol.   ACTIVITY:  May be increased slowly as tolerated.   FOLLOWUP:  Follow up with Dr. Nathanial Rancher, primary care physician, next 5 to  7 days.  Patient may need endocrinology followup at the primary care  physician's discretion as an outpatient.   DISCHARGE MEDICATIONS:  1. Atenolol 12.5 mg b.i.d.  2. Antivert 25 mg q.8h. p.r.n. for dizziness.  3. Xanax 0.25 mg b.i.d. p.r.n.   PHYSICAL EXAMINATION:  GENERAL:  On examination, she is a young lady,  not in acute respiratory  distress.  VITAL SIGNS:  Temperature 97.5, pulse 65, respiratory rate 18, blood  pressure 133/81 supine, 153/86 sitting, 148/89 standing.  HEENT:  Pupils equal, reactive to light and accommodation.  Head is  normocephalic, atraumatic mucous membranes are moist.  Oropharynx and  nasopharynx are clear.  NECK:  Supple.  There is no lymphadenopathy.  No evidence of JVD or  carotid bruit.  CHEST:  Clinically clear.  HEART:  Sounds 1 and 2, regular rate and rhythm.  There are no thrills,  rubs, gallops or murmurs.  ABDOMEN:  Soft.  She has truncal obesity.  No tenderness.  No  organomegaly.  There is no ventral hernia.  CNS:  Is grossly normal.  Cranial nerves with no deficits.  No ataxic  gait.  EXTREMITIES:  Peripheral pulses present.  No pedal edema.   LABORATORY DATA:  WBCs 7, hematocrit 40, platelet count 228,000.  Chemistries:  Sodium 141, potassium 4.0, chloride 110, bicarbonate 22,  glucose 91, BUN 10, creatinine 0.79.  Her serial cardiac panel on this  admission was normal.  Her urine culture had no growth.   Discharge time less than 30 minutes.      Herbie Saxon, MD  Electronically Signed     MIO/MEDQ  D:  05/14/2008  T:  05/14/2008  Job:  [id number]   cc:   Dr. Nathanial Rancher, PCP

## 2010-07-18 NOTE — H&P (Signed)
Kristin Hickman, Kristin Hickman                ACCOUNT NO.:  [id number]   MEDICAL RECORD NO.:  [id number]          PATIENT TYPE:  INP   LOCATION:  0157                         FACILITY:  [facility]   PHYSICIAN:  Oswald Hillock, MD        DATE OF BIRTH:  [birth year]   DATE OF ADMISSION:  05/12/2008  DATE OF DISCHARGE:                              HISTORY & PHYSICAL   CHIEF COMPLAINT:  Dizziness.   HISTORY OF PRESENT ILLNESS:  The patient is a 40 year old Caucasian  female who presents to emergency room with complaints of dizziness.  Apparently she was admitted with similar symptoms about 2 days back and  stayed in the hospital for 2 days.  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  The patient returns with essentially similar symptoms.  Denies any chest pain, any shortness of breath, palpitations, loss of  consciousness or any focal weakness of any part of the body.  No fever,  rigors, chills reported by the patient either.  No nausea, vomiting,  diarrhea or any urinary symptoms.   PAST MEDICAL HISTORY:  1. Hypertension.  2. Chronic back pain with surgery.  3. Removal of the right submandibular salivary gland.  4. Benign breast disease status post biopsy in 2007.   SOCIAL HISTORY:  The patient smokes half a pack per day, is a primary  Engineer, site.  Denies any alcohol or drug use.   FAMILY HISTORY:  Positive for diabetes and coronary arteries in her  father, mother had cerebral palsy and hypertension.   CURRENT MEDICATIONS:  The patient is currently taking no medications  though she does have a prescription for atenolol.   REVIEW OF SYSTEMS:  An extensive review of systems done, all systems are  negative except for the positives mentioned in the history of present  illness.   PHYSICAL EXAMINATION:  VITALS:  Pulse 104, blood pressure  120/84,  respiratory rate of 20, O2 sats of 100% on room air.  ORTHOSTATICS:  Vitals reveal a change of about 20 beats per minute in  her heart rate from lying down to standing, no significant blood  pressure changes though.  GENERAL:  The patient is conscious, alert, oriented to time, place and  person, in no acute distress.  HEENT:  No scleral icterus, no conjunctival injection, no pallor.  Ears  negative.  Poor dental hygiene.  NECK:  Supple, no lymphadenopathy, no JVD, no carotid bruit.  CHEST:  CTA bilaterally.  CVS:  S1 - S2 plus, regular, no gallop, rub or murmur.  ABDOMEN:  Obese, nontender, bowel sounds present.  EXTREMITIES:  No cyanosis, clubbing or edema.  NEUROLOGIC:  Cranial nerves II through XII are grossly intact, no focal  motor or sensory deficits noted on gross examination, specifically no  hearing loss and no nystagmus noted.   LABORATORY DATA:  Her chest x-ray was negative.  Her WBC count was 9.2,  hemoglobin 13.9, hematocrit 47.9, platelet count of 262, sodium 141,  potassium  4, chloride 110, CO2 - 22, glucose 91, BUN 10, creatinine  0.79.  Her urinalysis showed trace leuko-esterase, negative nitrite.  Her myoglobin was 34.3, CK-MB less than 1, troponin 0.05.  EKG showed  normal sinus rhythm, normal PR interval, normal QRS duration.  No acute  ST or T-wave changes.  No previous EKGs available for comparison.   IMPRESSION AND PLAN:  This is a case of a 40 year old Caucasian female  with history of hypertension in the past who presents with complaints of  dizziness.  1. Dizziness/vertigo, the patient has had an extensive workup      including consultation with endocrinology in the recent past.  Her      laboratory parameters are essentially within normal limits.  The      patient does have a significant degree of anxiety and that may be      her main problem.  We will admit her for overnight observation as      she is reluctant to be discharged home in her  current condition.      Will repeat her labs in the morning.  As she has had an extensive      eval including cortisone level and an MRI in the recent past we      will not repeat any of those labs.  The patient will be put on      heparin for deep venous thrombosis prophylaxis and Pepcid for      gastrointestinal prophylaxis.  We may need to consult psych in the      morning, follow up with the their recommendations.  Also will      repeat her orthostatics in the morning and we will discontinue her      atenolol use completely.      Oswald Hillock, MD  Electronically Signed     BA/MEDQ  D:  05/12/2008  T:  05/13/2008  Job:  [id number]   cc:   Primary Care Physician

## 2010-07-18 NOTE — Op Note (Signed)
NAMERAGAN, DUHON                ACCOUNT NO.:  [id number]   MEDICAL RECORD NO.:  [id number]          PATIENT TYPE:  OIB   LOCATION:  3528                         FACILITY:  [facility]   PHYSICIAN:  Danae Orleans. Venetia Maxon, M.D.  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   PROCEDURES:  Left L4-5 redo microdiskectomy with microdissection.   SURGEON:  Danae Orleans. Venetia Maxon, MD   ASSISTANT:  Stefani Dama, MD   ANESTHESIA:  General endotracheal anesthesia.   ESTIMATED BLOOD LOSS:  Minimal.   COMPLICATIONS:  None.   DISPOSITION:  Recovery.   INDICATIONS:  Kristin Hickman is a 40 year old morbidly obese woman with a  large recurrent disk herniation at L4-5 on the left with what appeared  to be free fragment of herniated disk material and progressive weakness  in her left leg and dorsiflexors.  It was elected to take her to surgery  for redo microdiskectomy.   PROCEDURE:  Ms. Wanat was brought to the operating room.  Following  satisfactory and uncomplicated induction of general endotracheal  anesthesia and placement of intravenous lines, the patient was placed in  a prone position on the Wilson frame.  Her low back was prepped and  draped in usual sterile fashion.  Previous incision was reopened,  carried through copious adipose tissue.  The lumbodorsal fascia was  incised sharply.  Subperiosteal dissection was performed exposing what  was felt to be the L4-5 level.  Marker probes were placed at what turned  out to be L3-4 and L4-5 levels.  Further dissection was performed  through scar tissue exposing the L4-5 interspace.  A thin rim of laminar  bone was then removed of L4 and foraminotomy was performed overlying the  L5 nerve root and under loupe magnification, lateral decompression and  removing some of the medial facet was also performed.  Under  microdissection technique, the nerve root was mobilized medially and  along with the thecal sac, it was quite difficult to mobilize the nerve  root through with significant scarring but with painstaking  microdissection, nerve root was mobilized, interspace was incised, and  disk material was removed in piecemeal fashion both medially and  laterally.  There was quite significant amount of degeneration at this  level.  After again more painstaking microdissection along the inferior  aspect, the L5 nerve root was mobilized and a large free fragment of  herniated disk material was removed with resultant significant  decompression of left L5 nerve root.  Hemostasis was then assured.  Soft  tissues were inspected and found to be in good repair.  The wound was  bathed with Depo-Medrol and fentanyl after irrigating and lumbodorsal  fascia was closed with 0 Vicryl suture, subcutaneous tissues were  reapproximated with 2-0 Vicryl interrupted inverted sutures, and skin  edges were reapproximated with interrupted 3-0 Vicryl subcuticular  stitch.  The wound was dressed with Dermabond.  The patient was  extubated in the operating room and taken to the recovery room in stable  and satisfactory condition.  The patient tolerated the operation well.  Counts were correct at the end of the case.      Danae Orleans. Venetia Maxon, M.D.  Electronically Signed     JDS/MEDQ  D:  10/23/2007  T:  10/24/2007  Job:  [id number]

## 2010-11-28 LAB — CSF CELL COUNT WITH DIFFERENTIAL
Tube #: 1
WBC, CSF: 2
WBC, CSF: 3

## 2010-11-28 LAB — CSF CULTURE W GRAM STAIN: Culture: NO GROWTH

## 2010-11-28 LAB — GRAM STAIN

## 2010-11-28 LAB — PROTEIN AND GLUCOSE, CSF: Total  Protein, CSF: 42

## 2011-01-26 ENCOUNTER — Other Ambulatory Visit: Payer: Self-pay

## 2011-01-26 ENCOUNTER — Encounter: Payer: Self-pay | Admitting: Emergency Medicine

## 2011-01-26 ENCOUNTER — Emergency Department (HOSPITAL_COMMUNITY)
Admission: EM | Admit: 2011-01-26 | Discharge: 2011-01-27 | Disposition: A | Discharge status: Home / Self Care | Payer: Self-pay | Attending: Emergency Medicine | Admitting: Emergency Medicine

## 2011-01-26 ENCOUNTER — Emergency Department (HOSPITAL_COMMUNITY): Payer: Self-pay

## 2011-01-26 DIAGNOSIS — R0789 Other chest pain: Secondary | ICD-10-CM | POA: Insufficient documentation

## 2011-01-26 DIAGNOSIS — R112 Nausea with vomiting, unspecified: Secondary | ICD-10-CM | POA: Insufficient documentation

## 2011-01-26 DIAGNOSIS — E2749 Other adrenocortical insufficiency: Secondary | ICD-10-CM | POA: Insufficient documentation

## 2011-01-26 DIAGNOSIS — I10 Essential (primary) hypertension: Secondary | ICD-10-CM | POA: Insufficient documentation

## 2011-01-26 DIAGNOSIS — F17200 Nicotine dependence, unspecified, uncomplicated: Secondary | ICD-10-CM | POA: Insufficient documentation

## 2011-01-26 HISTORY — DX: Primary adrenocortical insufficiency: E27.1

## 2011-01-26 HISTORY — DX: Essential (primary) hypertension: I10

## 2011-01-26 MED ORDER — MORPHINE SULFATE 4 MG/ML IJ SOLN
4.0000 mg | Freq: Once | INTRAMUSCULAR | Status: AC
  Administered 2011-01-27: 4 mg via INTRAVENOUS
  Filled 2011-01-26: qty 1

## 2011-01-26 MED ORDER — ASPIRIN 81 MG PO CHEW
324.0000 mg | CHEWABLE_TABLET | Freq: Once | ORAL | Status: AC
  Administered 2011-01-27: 324 mg via ORAL
  Filled 2011-01-26: qty 4

## 2011-01-26 MED ORDER — SODIUM CHLORIDE 0.9 % IJ SOLN: 3.0000 mL | Freq: Two times a day (BID) | INTRAMUSCULAR | Status: DC

## 2011-01-26 MED ORDER — SODIUM CHLORIDE 0.9 % IJ SOLN: 3.0000 mL | INTRAMUSCULAR | Status: DC | PRN

## 2011-01-26 MED ORDER — SODIUM CHLORIDE 0.9 % IV SOLN: 250.0000 mL | INTRAVENOUS | Status: DC

## 2011-01-26 NOTE — ED Notes (Signed)
Pt states that around 10 pm tonight she was sitting at home when she had a sudden onset of "squeezing pressure" in her left chest that radiated to her back/lt arm.  hx of addison's disease.

## 2011-01-27 LAB — BASIC METABOLIC PANEL
CO2: 23 mEq/L (ref 19–32)
Calcium: 8.8 mg/dL (ref 8.4–10.5)
Creatinine, Ser: 0.71 mg/dL (ref 0.50–1.10)
Glucose, Bld: 84 mg/dL (ref 70–99)

## 2011-01-27 LAB — CARDIAC PANEL(CRET KIN+CKTOT+MB+TROPI)
Relative Index: INVALID (ref 0.0–2.5)
Total CK: 55 U/L (ref 7–177)

## 2011-01-27 LAB — D-DIMER, QUANTITATIVE: D-Dimer, Quant: 0.22 ug/mL-FEU (ref 0.00–0.48)

## 2011-01-27 LAB — DIFFERENTIAL
Basophils Absolute: 0 10*3/uL (ref 0.0–0.1)
Basophils Relative: 0 % (ref 0–1)
Eosinophils Relative: 2 % (ref 0–5)
Monocytes Absolute: 0.7 10*3/uL (ref 0.1–1.0)

## 2011-01-27 LAB — CBC
HCT: 36.5 % (ref 36.0–46.0)
MCHC: 33.7 g/dL (ref 30.0–36.0)
MCV: 86.9 fL (ref 78.0–100.0)
RDW: 14.2 % (ref 11.5–15.5)

## 2011-01-27 MED ORDER — GI COCKTAIL ~~LOC~~
30.0000 mL | Freq: Once | ORAL | Status: AC
  Administered 2011-01-27: 30 mL via ORAL
  Filled 2011-01-27: qty 30

## 2011-01-27 MED ORDER — MORPHINE SULFATE 4 MG/ML IJ SOLN
4.0000 mg | Freq: Once | INTRAMUSCULAR | Status: AC
  Administered 2011-01-27: 4 mg via INTRAVENOUS
  Filled 2011-01-27: qty 1

## 2011-01-27 MED ORDER — PROMETHAZINE HCL 25 MG PO TABS: 25.0000 mg | ORAL_TABLET | Freq: Four times a day (QID) | ORAL | Status: DC | PRN

## 2011-01-27 MED ORDER — ONDANSETRON HCL 4 MG/2ML IJ SOLN
INTRAMUSCULAR | Status: AC
  Filled 2011-01-27: qty 2

## 2011-01-27 MED ORDER — FAMOTIDINE 40 MG PO TABS: 40.0000 mg | ORAL_TABLET | Freq: Every day | ORAL | Status: DC

## 2011-01-27 MED ORDER — ONDANSETRON HCL 4 MG/2ML IJ SOLN
4.0000 mg | Freq: Once | INTRAMUSCULAR | Status: AC
  Administered 2011-01-27: 03:00:00 via INTRAVENOUS

## 2011-01-27 MED ORDER — ONDANSETRON HCL 4 MG/2ML IJ SOLN: 4.0000 mg | Freq: Once | INTRAMUSCULAR | Status: DC

## 2011-01-27 MED ORDER — PROMETHAZINE HCL 25 MG/ML IJ SOLN
25.0000 mg | Freq: Once | INTRAMUSCULAR | Status: AC
  Administered 2011-01-27: 25 mg via INTRAVENOUS
  Filled 2011-01-27: qty 1

## 2011-01-27 MED ORDER — ASPIRIN 81 MG PO CHEW
CHEWABLE_TABLET | ORAL | Status: AC
  Administered 2011-01-27: 324 mg via ORAL
  Filled 2011-01-27: qty 1

## 2011-01-27 MED ORDER — ONDANSETRON HCL 4 MG PO TABS: 4.0000 mg | ORAL_TABLET | Freq: Four times a day (QID) | ORAL | Status: AC

## 2011-01-27 MED ORDER — SUCRALFATE 1 G PO TABS: 1.0000 g | ORAL_TABLET | Freq: Four times a day (QID) | ORAL | Status: DC

## 2011-01-27 NOTE — ED Notes (Signed)
Pt taken water and encouraged to drink slowly.  

## 2011-01-27 NOTE — ED Notes (Signed)
Pt given po fluids, tolerating without difficulty. No nausea/vomiting at present

## 2011-01-27 NOTE — ED Notes (Signed)
Pt c/o nausea, MD Otter notified, zofran given as ordered, pt states pain has improved

## 2011-01-27 NOTE — ED Provider Notes (Signed)
History     CSN: [id number] Arrival date & time: 01/26/2011 10:35 PM   First MD Initiated Contact with Patient 01/26/11 2310      Chief Complaint  Patient presents with  . Chest Pain    lt arm/back pain    (Consider location/radiation/quality/duration/timing/severity/associated sxs/prior treatment) HPI 40 year old female presents to emergency room with complaint of acute onset of left upper chest pain starting around 10 PM tonight. Patient reports pain is heavy, pressure, and aching. No prior history of same. Patient was riding the car when it occurred. No leg swelling, no history of PE or DVT. No prolonged immobility or exogenous estrogen. Patient does have history of Addison's and has been out of her prednisone for last 6 weeks. No family history of cardiac disease. Patient smokes one pack a day. She has had no fever or cough. No prior history of GERD or reflux symptoms. No abdominal pain Past Medical History  Diagnosis Date  . Addison disease   . Hypertension     Past Surgical History  Procedure Date  . Back surgery     History reviewed. No pertinent family history.  History  Substance Use Topics  . Smoking status: Current Everyday Smoker -- 2.0 packs/day  . Smokeless tobacco: Not on file  . Alcohol Use: No    OB History    Grav Para Term Preterm Abortions TAB SAB Ect Mult Living                  Review of Systems  All other systems reviewed and are negative.    Allergies  Codeine  Home Medications   Current Outpatient Rx  Name Route Sig Dispense Refill  . BC HEADACHE PO Oral Take 1 Package by mouth.        BP 125/84  Pulse 77  Temp(Src) 97.8 F (36.6 C) (Oral)  Resp 14  Ht 5\' 6"  (1.676 m)  Wt 135 lb (61.236 kg)  BMI 21.79 kg/m2  SpO2 99%  Physical Exam  Nursing note and vitals reviewed. Constitutional: She is oriented to person, place, and time. She appears well-developed and well-nourished.       Patient is uncomfortable appearing  HENT:    Head: Normocephalic and atraumatic.  Right Ear: External ear normal.  Left Ear: External ear normal.  Nose: Nose normal.  Mouth/Throat: Oropharynx is clear and moist.  Eyes: Conjunctivae and EOM are normal. Pupils are equal, round, and reactive to light.  Neck: Normal range of motion. Neck supple. No JVD present. No tracheal deviation present. No thyromegaly present.  Cardiovascular: Normal rate, regular rhythm, normal heart sounds and intact distal pulses.  Exam reveals no gallop and no friction rub.   No murmur heard. Pulmonary/Chest: Effort normal and breath sounds normal. No stridor. No respiratory distress. She has no wheezes. She has no rales. She exhibits no tenderness.  Abdominal: Soft. Bowel sounds are normal. She exhibits no distension and no mass. There is no tenderness. There is no rebound and no guarding.  Musculoskeletal: Normal range of motion. She exhibits no edema and no tenderness.  Lymphadenopathy:    She has no cervical adenopathy.  Neurological: She is oriented to person, place, and time. She has normal reflexes. No cranial nerve deficit. She exhibits normal muscle tone. Coordination normal.  Skin: Skin is dry. No rash noted. No erythema. No pallor.  Psychiatric: She has a normal mood and affect. Her behavior is normal. Judgment and thought content normal.    ED Course  Procedures (  including critical care time)   Labs Reviewed  CARDIAC PANEL(CRET KIN+CKTOT+MB+TROPI)  BASIC METABOLIC PANEL  D-DIMER, QUANTITATIVE  CBC  DIFFERENTIAL   Dg Chest 2 View  01/26/2011  *RADIOLOGY REPORT*  Clinical Data: Left-sided chest pain and shortness of breath; history of smoking.  CHEST - 2 VIEW  Comparison: Chest radiograph performed 05/12/2008  Findings: The lungs are well-aerated.  Mild peribronchial thickening is noted.  There is no evidence of focal opacification, pleural effusion or pneumothorax.  The heart is normal in size; the mediastinal contour is within normal limits.   No acute osseous abnormalities are seen.  IMPRESSION: Mild peribronchial thickening noted; lungs otherwise clear.  Original Report Authenticated By: Tonia Ghent, M.D.    Date: 01/27/2011  Rate: 78   Rhythm: normal sinus rhythm  QRS Axis: normal  Intervals: normal  ST/T Wave abnormalities: normal  Conduction Disutrbances:none  Narrative Interpretation:   Old EKG Reviewed: unchanged   No diagnosis found.    MDM  40 year old female with no risk factors for coronary disease presents with left upper chest pain. Workup included chest x-ray, d-dimer, cardiac markers and EKG. No abnormalities noted in any. Some relief with morphine improvement with GI cocktail and second dose of morphine the patient developed nausea and vomiting. Not feel symptoms are due to PE, acute coronary syndrome or unstable angina. Possible esophageal spasm.  Will give patient outpatient follow up        Olivia Mackie, MD 01/27/11 [phone]

## 2011-01-27 NOTE — ED Notes (Signed)
MD at bedside. 

## 2011-07-09 ENCOUNTER — Encounter (HOSPITAL_COMMUNITY): Payer: Self-pay

## 2011-07-09 ENCOUNTER — Emergency Department (INDEPENDENT_AMBULATORY_CARE_PROVIDER_SITE_OTHER): Payer: Self-pay

## 2011-07-09 ENCOUNTER — Emergency Department (INDEPENDENT_AMBULATORY_CARE_PROVIDER_SITE_OTHER)
Admission: EM | Admit: 2011-07-09 | Discharge: 2011-07-09 | Disposition: A | Discharge status: Home / Self Care | Payer: Self-pay | Source: Home / Self Care | Attending: Emergency Medicine | Admitting: Emergency Medicine

## 2011-07-09 DIAGNOSIS — M549 Dorsalgia, unspecified: Secondary | ICD-10-CM

## 2011-07-09 MED ORDER — HYDROCODONE-ACETAMINOPHEN 5-325 MG PO TABS
ORAL_TABLET | ORAL | Status: AC
  Filled 2011-07-09: qty 1

## 2011-07-09 MED ORDER — TRAMADOL HCL 50 MG PO TABS: 50.0000 mg | ORAL_TABLET | Freq: Four times a day (QID) | ORAL | Status: AC | PRN

## 2011-07-09 MED ORDER — KETOROLAC TROMETHAMINE 30 MG/ML IJ SOLN
30.0000 mg | Freq: Once | INTRAMUSCULAR | Status: AC
  Administered 2011-07-09: 30 mg via INTRAMUSCULAR

## 2011-07-09 MED ORDER — HYDROCODONE-ACETAMINOPHEN 5-325 MG PO TABS
1.0000 | ORAL_TABLET | Freq: Once | ORAL | Status: AC
  Administered 2011-07-09: 1 via ORAL

## 2011-07-09 MED ORDER — KETOROLAC TROMETHAMINE 30 MG/ML IJ SOLN
INTRAMUSCULAR | Status: AC
  Filled 2011-07-09: qty 1

## 2011-07-09 MED ORDER — MELOXICAM 7.5 MG PO TABS: 7.5000 mg | ORAL_TABLET | Freq: Every day | ORAL | Status: DC

## 2011-07-09 MED ORDER — TRAMADOL HCL 50 MG PO TABS: 50.0000 mg | ORAL_TABLET | Freq: Four times a day (QID) | ORAL | Status: DC | PRN

## 2011-07-09 NOTE — ED Notes (Signed)
Husband came back to treatment area after discharge requesting that he have documentation of norco given here.  I apologized and discuss with them the logistics of why it did not display.  He stated she needed to keep up with what she has taken for her back. Meanwhile, pt asked registration staff if she could have her tramadol rx switched to norco.  Spoke with Dr Ladon Applebaum who stated he will not switch the rx.  Pt instructed to take the tramadol and if no improvement to see her doctor for further pain control.  Pt agreeable and voiced understanding.

## 2011-07-09 NOTE — Discharge Instructions (Signed)
Discussed her x-ray results and your exam. The pain was to persist you will need further evaluation with the orthopedic Dr. or your primary care Dr. He sustained no fractures or dislocations on this fall we have prescribed you a combination of medicines that should help you with your current pain and discomfort.

## 2011-07-09 NOTE — ED Notes (Signed)
Pt states she slipped down approx. 4 interior hardwood steps this am at home.  C/o low back pain that is worse on rt side.  Hx of 2 back surgeries in the past.

## 2011-07-09 NOTE — ED Provider Notes (Signed)
History     CSN: [id number]  Arrival date & time 07/09/11  4098   First MD Initiated Contact with Patient 07/09/11 1031      Chief Complaint  Patient presents with  . Back Pain  . Fall    (Consider location/radiation/quality/duration/timing/severity/associated sxs/prior treatment) HPI Comments: Patient presents urgent care today after she slipped and fell from some [place] stairs. She was able to grab herself a bit so she did not impact the floor with her back directly. She is planning her back hurts she walks or when she moves. Patient denies any numbness, tingling to her lower extremities. Gluteal area. Patient also denies any urinary symptoms or changes. And no distal weakness of her lower extremities.  Patient is a 41 y.o. female presenting with back pain and fall. The history is provided by the patient and the spouse.  Back Pain  This is a new problem. The current episode started 3 to 5 hours ago. The problem occurs constantly. The problem has been gradually worsening. The pain is associated with falling. The quality of the pain is described as aching. The pain is at a severity of 8/10. The pain is moderate. The symptoms are aggravated by twisting, bending and certain positions. Pertinent negatives include no fever, no numbness, no abdominal pain, no abdominal swelling, no bowel incontinence, no bladder incontinence, no dysuria, no pelvic pain, no paresthesias, no paresis, no tingling and no weakness. She has tried nothing for the symptoms. The treatment provided no relief.  Fall Pertinent negatives include no fever, no numbness, no abdominal pain, no bowel incontinence and no tingling.    Past Medical History  Diagnosis Date  . Addison disease   . Hypertension     Past Surgical History  Procedure Date  . Back surgery     No family history on file.  History  Substance Use Topics  . Smoking status: Current Everyday Smoker -- 2.0 packs/day  . Smokeless tobacco: Not on file    . Alcohol Use: No    OB History    Grav Para Term Preterm Abortions TAB SAB Ect Mult Living                  Review of Systems  Constitutional: Negative for fever and activity change.  Eyes: Negative for pain and itching.  Respiratory: Negative for cough and shortness of breath.   Gastrointestinal: Negative for abdominal pain and bowel incontinence.  Genitourinary: Negative for bladder incontinence, dysuria and pelvic pain.  Musculoskeletal: Positive for back pain.  Skin: Negative for color change and wound.  Neurological: Negative for tingling, weakness, numbness and paresthesias.    Allergies  Codeine  Home Medications   Current Outpatient Rx  Name Route Sig Dispense Refill  . BC HEADACHE PO Oral Take 1 Package by mouth.      . FAMOTIDINE 40 MG PO TABS Oral Take 1 tablet (40 mg total) by mouth daily. 30 tablet 0  . SUCRALFATE 1 G PO TABS Oral Take 1 tablet (1 g total) by mouth 4 (four) times daily. 30 tablet 0    BP 105/61  Pulse 90  Temp(Src) 98.7 F (37.1 C) (Oral)  Resp 20  SpO2 98%  Physical Exam  Nursing note and vitals reviewed. Constitutional: She appears well-developed and well-nourished.  HENT:  Head: Normocephalic.  Eyes: Conjunctivae are normal.  Neck: Neck supple.  Musculoskeletal: She exhibits tenderness.       Lumbar back: She exhibits decreased range of motion, tenderness, bony tenderness  and pain. She exhibits no swelling, no edema, no deformity, no spasm and normal pulse.       Back:  Neurological: She is alert. She displays no atrophy and no tremor. She exhibits normal muscle tone. Coordination normal.    ED Course  Procedures (including critical care time)   Labs Reviewed  POCT PREGNANCY, URINE   No results found.   No diagnosis found.    MDM  Patient presents to urgent care with sudden onset of back pain after she sustained a fall after she slipped from stairs.        Jimmie Molly, MD 07/09/11 1104

## 2011-08-13 ENCOUNTER — Emergency Department (HOSPITAL_COMMUNITY)
Admission: EM | Admit: 2011-08-13 | Discharge: 2011-08-14 | Disposition: A | Discharge status: Home / Self Care | Payer: Self-pay | Attending: Emergency Medicine | Admitting: Emergency Medicine

## 2011-08-13 ENCOUNTER — Encounter (HOSPITAL_COMMUNITY): Payer: Self-pay

## 2011-08-13 DIAGNOSIS — R1110 Vomiting, unspecified: Secondary | ICD-10-CM | POA: Insufficient documentation

## 2011-08-13 DIAGNOSIS — I10 Essential (primary) hypertension: Secondary | ICD-10-CM | POA: Insufficient documentation

## 2011-08-13 DIAGNOSIS — F17200 Nicotine dependence, unspecified, uncomplicated: Secondary | ICD-10-CM | POA: Insufficient documentation

## 2011-08-13 DIAGNOSIS — E2749 Other adrenocortical insufficiency: Secondary | ICD-10-CM | POA: Insufficient documentation

## 2011-08-13 LAB — URINALYSIS, ROUTINE W REFLEX MICROSCOPIC
Glucose, UA: NEGATIVE mg/dL
Hgb urine dipstick: NEGATIVE
Leukocytes, UA: NEGATIVE
Specific Gravity, Urine: 1.006 (ref 1.005–1.030)
pH: 8 (ref 5.0–8.0)

## 2011-08-13 MED ORDER — DICYCLOMINE HCL 20 MG PO TABS
20.0000 mg | ORAL_TABLET | Freq: Once | ORAL | Status: AC
  Administered 2011-08-13: 20 mg via ORAL
  Filled 2011-08-13: qty 1

## 2011-08-13 MED ORDER — ONDANSETRON HCL 4 MG/2ML IJ SOLN
4.0000 mg | Freq: Once | INTRAMUSCULAR | Status: AC
  Administered 2011-08-13: 4 mg via INTRAVENOUS
  Filled 2011-08-13: qty 2

## 2011-08-13 MED ORDER — SODIUM CHLORIDE 0.9 % IV BOLUS (SEPSIS)
1000.0000 mL | Freq: Once | INTRAVENOUS | Status: AC
  Administered 2011-08-13: 1000 mL via INTRAVENOUS

## 2011-08-13 MED ORDER — ONDANSETRON HCL 4 MG PO TABS: 4.0000 mg | ORAL_TABLET | Freq: Four times a day (QID) | ORAL | Status: AC

## 2011-08-13 NOTE — Discharge Instructions (Signed)
Follow up with a doctor if your symptoms return.  See your doctor immediately--or return to the ED--with any new or troubling symptoms including fevers, weakness, new chest pain, shortness or breath, numbness, or any other concerning symptom.         RESOURCE GUIDE  Chronic Pain Problems: Contact [facility]  [phone] Patients need to be referred by their primary care doctor.  Insufficient Money for Medicine: Contact United Way:  call "211" or [facility] [phone].  No Primary Care Doctor: - Call [facility]  [phone] - can help you locate a primary care doctor that  accepts your insurance, provides certain services, etc. - Physician Referral Service- [phone]  Agencies that provide inexpensive medical care: - [facility]  [phone] - [facility]  [phone] - [facility]  [phone] - Women's Clinic  [phone] - Planned Parenthood  [phone] [facility]  [phone]  Medicaid-accepting [facility] [place] Providers: - [facility]- [address]  [phone], Mon-Fri 9am-7pm, Sat 9am-1pm - [facility]- [address] [place], [address] [place]  [phone] - [facility]- [address] [place]  [phone] [facility]- [address]  [phone] - Renaye Rakers- [address] [place], [address], [phone]  Only accepts [place] patients after they have their name  applied to their card  Self Pay (no insurance) in [place]: - Sickle Cell Patients: Dr Willey Blade, [facility]  [address] [place], [phone] - [facility]- [address] [place]  [phone]       [facility] [place]- [address]       -     [facility]- see information above (Speak to [employer] if you do not have insurance)       -  [facility]- [address]  [place], [phone]       -  [facility]- [address],  [phone]       -  [facility]- [address] [phone]       -  Dr Julio Sicks-  [address], [place], [phone]       -  [facility]- [address], [phone]       -  [facility]- [address], [phone], also [address], [phone]       -    [facility]- [address] [place], [phone], 1st & 3rd Saturday   every month, 10am-1pm  1) Find a Doctor and Pay Out of Pocket Although you won't have to find out who is covered by your insurance plan, it is a good idea to ask around and get recommendations. You will then need to call the office and see if the doctor you have chosen will accept you as a new patient and what types of options they offer for patients who are self-pay. Some doctors offer discounts or will set up payment plans for their patients who do not have insurance, but you will need to ask so you aren't surprised when you get to your appointment.  2) Contact Your Local Health Department Not all health departments have doctors that can see patients for sick visits, but many do, so it  is worth a call to see if yours does. If you don't know where your local health department is, you can check in your phone book. The CDC also has a tool to help you locate your state's health department, and many state websites also have listings of all of their local health departments.  3) Find a Walk-in Clinic If your illness is not likely to be very severe or complicated, you may want to try a walk in clinic. These are popping up all over the country in pharmacies, drugstores, and shopping centers. They're usually staffed by nurse practitioners or physician assistants that have been trained to treat common illnesses and complaints. They're usually fairly quick and inexpensive. However, if you have serious medical issues or chronic medical problems, these  are probably not your best option  STD Testing - [facility] Department of [facility], STD Clinic, [address] [place], phone [phone] or [phone].  Monday - Friday, call for an appointment. [facility] Department of [employer], STD Clinic, [place] [address], [place], phone [phone] or [phone].  Monday - Friday, call for an appointment.  Abuse/Neglect: [facility] Child Abuse Hotline 815 619 2493 [facility] Child Abuse Hotline 364-191-3192 (After Hours)  Emergency Shelter:  [facility] 2184168365  Maternity Homes: - [facility] [place] [facility] [phone] - [facility] [phone]  MRSA Hotline #:   918-840-6152  [facility] Resources  [facility] [place]  United Way [facility] Dept. [address]                 [address]         [address] [place] [address]  Blondell Reveal Phone:  [phone]                                  Phone:  [phone]                   Phone:  [phone]  [facility] Mental Health, [phone] - [phone]       -     [facility] in [place], [address]                                  [phone], [facility] Child Abuse Hotline (580) 505-5074 or 615-272-4769 (After Hours)   Behavioral Health Services  Substance Abuse Resources: - [facility]  (865)168-7523 - [facility] [phone] - [facility] [phone] Floydene Flock [phone] - Residential & Outpatient Substance Abuse Program  ([phone]  Psychological Services: [facility]  [phone] Services  [phone] - [facility], [phone] [place]. [address], [place], ACCESS  LINE: 443-021-4954 or 863-688-8752, [employer]  Dental Assistance  If unable to pay or uninsured, contact:  [facility] or [facility]. to become qualified for the adult dental clinic.  Patients with Medicaid: [facility] [phone] [address], [phone] [address], [phone]  If unable to pay, or uninsured, contact [facility] [phone]) or [facility] Department [phone] in [place], [phone] in [facility]) to become qualified for the adult dental clinic  Other Low-Cost Community Dental Services: - [facility]- [address] [place], [place], [place], [address], [phone], [phone], 2nd and 4th Thursday of the month at 6:30am.  10 clients each day by appointment, can sometimes see walk-in patients if someone does not show for an appointment. [facility]- [address] [place], [place], [address], [phone] - [facility]- [address] [place], [place], [address], [phone] - [facility]- [phone] [facility] Health Department- [phone] [facility] Department- [phone]

## 2011-08-13 NOTE — ED Notes (Signed)
Warm blanket for pt.

## 2011-08-13 NOTE — ED Notes (Signed)
Pt aware that we need urine specimen. Self ambulated to bathroom

## 2011-08-13 NOTE — ED Notes (Signed)
Patient reporting n/v since last night constantly, especially every time she eats or drinks. Patient recently had a refrigerator/freezer break and she's been attempting to cook and eat food to save it from spoiling.

## 2011-08-13 NOTE — ED Provider Notes (Signed)
History     CSN: [id number]  Arrival date & time 08/13/11  1840   First MD Initiated Contact with Patient 08/13/11 2030      Chief Complaint  Patient presents with  . Emesis    (Consider location/radiation/quality/duration/timing/severity/associated sxs/prior treatment) HPI  Patient 41 year old female with 18 hour history of nausea and vomiting. She reports 10 rounds of emesis nonbilious nonbloody. Started last night several rounds and then picked up again this morning several rounds. She endorses good urine output. She denies any frank abdominal pain but does endorse some abdominal cramping. Cramping is 2/10. She denies any dysuria. She denies any back pain. She denies any fever or chills, confusion shortness of breath or chest pain. She calls her illness moderate. She thinks it's related to her cleaning up some semi-frozen raw food yesterday. He has no sick contacts. Her vital signs are normal on arrival. Nothing makes her symptoms better or worse. She has had one loose stool.  Past Medical History  Diagnosis Date  . Addison disease   . Hypertension     Past Surgical History  Procedure Date  . Back surgery     No family history on file.  History  Substance Use Topics  . Smoking status: Current Everyday Smoker -- 2.0 packs/day  . Smokeless tobacco: Not on file  . Alcohol Use: No    OB History    Grav Para Term Preterm Abortions TAB SAB Ect Mult Living                  Review of Systems Constitutional: Negative for fever and chills.  HENT: Negative for ear pain, sore throat and trouble swallowing.   Eyes: Negative for pain and visual disturbance.  Respiratory: Negative for cough and shortness of breath.   Cardiovascular: Negative for chest pain and leg swelling.  Gastrointestinal: POS for nausea, vomiting, neg abdominal pain and POS one loose stool.  Genitourinary: Negative for dysuria, urgency and frequency.  Musculoskeletal: Negative for back pain and joint  swelling.  Skin: Negative for rash and wound.  Neurological: Negative for dizziness, syncope, speech difficulty, weakness and numbness.    Allergies  Codeine  Home Medications   Current Outpatient Rx  Name Route Sig Dispense Refill  . BISMUTH SUBSALICYLATE 262 MG PO CHEW Oral Chew 524 mg by mouth daily as needed. For upset stomach    . IBUPROFEN 200 MG PO TABS Oral Take 400 mg by mouth every 6 (six) hours as needed. For pain    . LOPERAMIDE HCL 2 MG PO CAPS Oral Take 2 mg by mouth 4 (four) times daily as needed. For diarrhea      BP 122/79  Pulse 64  Temp(Src) 98.2 F (36.8 C) (Oral)  Resp 18  Ht 5\' 7"  (1.702 m)  Wt 135 lb (61.236 kg)  BMI 21.14 kg/m2  SpO2 99%  LMP 06/13/2011  Physical Exam Consitutional: Pt in no acute distress.   Head: Normocephalic and atraumatic.  Eyes: Extraocular motion intact, no scleral icterus Neck: Supple without meningismus, mass, or overt JVD Respiratory: Effort normal and breath sounds normal. No respiratory distress. CV: Heart regular rate and rhythm, no obvious murmurs.  Pulses +2 and symmetric Abdomen: Soft, non-tender, non-distended MSK: Extremities are atraumatic without deformity, ROM intact Skin: Warm, dry, intact Neuro: Alert and oriented, no motor deficit noted.  Psychiatric: Mood and affect are normal     ED Course  Procedures (including critical care time)   Labs Reviewed  POCT PREGNANCY, URINE  SPECIMEN HOLD   No results found.   No diagnosis found.    MDM  Well appearing female in no acute distress feeling very well after being given Zofran by nursing. Her exam is negative with a small exception of left-sided CVA tenderness which is mild.  Her illness has been brief and she has no symptoms or signs of electrolyte abnormality. Accordingly we will some to check her urine genitourinary pregnancy and discharge the patient home after some symptomatic management which will include Zofran, Bentyl, and a saline  bolus.  Patient feels much better with 0 nausea, no additional vomiting. Her cramping is dissipated as well.  PT DC home stable.  Discussed with pt the clinical impression, treatment in the ED, and follow up plan.  We alslo discussed the indications for returning to the ED, which include shortness or breath, confusion, fever, new weakness or numbness, chest pain, or any other concerning symptom.  The pt understood the treatment and plan, is stable, and is able to leave the ED.           Larrie Kass, MD 08/14/11 [phone]

## 2011-08-14 NOTE — ED Notes (Signed)
Patient is AOx4 and comfortable with her discharge instructions. 

## 2011-08-17 NOTE — ED Provider Notes (Signed)
reviewed the resident's note and I agree with the findings and plan.     Nelia Shi, MD 08/17/11 1120

## 2011-12-17 ENCOUNTER — Encounter (HOSPITAL_COMMUNITY): Payer: Self-pay | Admitting: Emergency Medicine

## 2011-12-17 ENCOUNTER — Emergency Department (INDEPENDENT_AMBULATORY_CARE_PROVIDER_SITE_OTHER)
Admission: EM | Admit: 2011-12-17 | Discharge: 2011-12-17 | Disposition: A | Discharge status: Home / Self Care | Payer: Self-pay | Source: Home / Self Care | Attending: Emergency Medicine | Admitting: Emergency Medicine

## 2011-12-17 DIAGNOSIS — B86 Scabies: Secondary | ICD-10-CM

## 2011-12-17 MED ORDER — PERMETHRIN 5 % EX CREA: TOPICAL_CREAM | CUTANEOUS | Status: DC

## 2011-12-17 NOTE — ED Notes (Signed)
Pt c/o poss scabies x1 month... States her stepson was treated for scabies x3 months... Sx include: itching on arms, hands, legs, abd, nausea... She denies: fevers, vomiting, diarrhea.. Pt is alert w/no signs of distress.

## 2011-12-17 NOTE — ED Provider Notes (Signed)
Chief Complaint  Patient presents with  . Rash    History of Present Illness:   Kristin Hickman is a 41 year old female who was exposed to scabies about 2 months ago. Ever since then she's had a pruritic rash on her hands, wrists, arms, legs, and abdomen. Her son also has a similar rash. She denies any difficulty breathing, swelling of her lips, tongue, or throat, or exposure to any other possible allergens.  Review of Systems:  Other than noted above, the patient denies any of the following symptoms: Systemic:  No fever, chills, sweats, weight loss, or fatigue. ENT:  No nasal congestion, rhinorrhea, sore throat, swelling of lips, tongue or throat. Resp:  No cough, wheezing, or shortness of breath. Skin:  No rash, itching, nodules, or suspicious lesions.  PMFSH:  Past medical history, family history, social history, meds, and allergies were reviewed.  Physical Exam:   Vital signs:  BP 121/72  Pulse 64  Temp 97.9 F (36.6 C) (Oral)  Resp 16  SpO2 100%  LMP 12/17/2011 Gen:  Alert, oriented, in no distress. ENT:  Pharynx clear, no intraoral lesions, moist mucous membranes. Lungs:  Clear to auscultation. Skin:  She has scattered eczematous denies areas in the interdigital webs, on the wrist, forearms, abdomen, and legs.  Assessment:  The encounter diagnosis was Scabies.  Plan:   1.  The following meds were prescribed:   New Prescriptions   PERMETHRIN (ELIMITE) 5 % CREAM    Apply head to toe at bedtime, including skin fold areas, hands, feet and between fingers and toes.  Leave on for at least 8 hours.  Scrub off next morning.  Repeat this same procedure in 1 week.   2.  The patient was instructed in symptomatic care and handouts were given. 3.  The patient was told to return if becoming worse in any way, if no better in 3 or 4 days, and given some red flag symptoms that would indicate earlier return.     Reuben Likes, MD 12/17/11 [phone]

## 2012-06-21 ENCOUNTER — Encounter (HOSPITAL_COMMUNITY): Payer: Self-pay | Admitting: Emergency Medicine

## 2012-06-21 ENCOUNTER — Emergency Department (HOSPITAL_COMMUNITY)
Admission: EM | Admit: 2012-06-21 | Discharge: 2012-06-21 | Disposition: A | Discharge status: Home / Self Care | Payer: Self-pay | Attending: Emergency Medicine | Admitting: Emergency Medicine

## 2012-06-21 DIAGNOSIS — J029 Acute pharyngitis, unspecified: Secondary | ICD-10-CM | POA: Insufficient documentation

## 2012-06-21 DIAGNOSIS — J020 Streptococcal pharyngitis: Secondary | ICD-10-CM

## 2012-06-21 DIAGNOSIS — F17200 Nicotine dependence, unspecified, uncomplicated: Secondary | ICD-10-CM | POA: Insufficient documentation

## 2012-06-21 DIAGNOSIS — Z8639 Personal history of other endocrine, nutritional and metabolic disease: Secondary | ICD-10-CM | POA: Insufficient documentation

## 2012-06-21 DIAGNOSIS — I10 Essential (primary) hypertension: Secondary | ICD-10-CM | POA: Insufficient documentation

## 2012-06-21 DIAGNOSIS — Z862 Personal history of diseases of the blood and blood-forming organs and certain disorders involving the immune mechanism: Secondary | ICD-10-CM | POA: Insufficient documentation

## 2012-06-21 MED ORDER — PENICILLIN G BENZATHINE 1200000 UNIT/2ML IM SUSP
1.2000 10*6.[IU] | Freq: Once | INTRAMUSCULAR | Status: AC
  Administered 2012-06-21: 1.2 10*6.[IU] via INTRAMUSCULAR
  Filled 2012-06-21: qty 2

## 2012-06-21 MED ORDER — ONDANSETRON HCL 4 MG/2ML IJ SOLN
4.0000 mg | Freq: Once | INTRAMUSCULAR | Status: AC
  Administered 2012-06-21: 4 mg via INTRAVENOUS
  Filled 2012-06-21: qty 2

## 2012-06-21 MED ORDER — KETOROLAC TROMETHAMINE 30 MG/ML IJ SOLN
30.0000 mg | Freq: Once | INTRAMUSCULAR | Status: AC
  Administered 2012-06-21: 30 mg via INTRAVENOUS
  Filled 2012-06-21: qty 1

## 2012-06-21 MED ORDER — SODIUM CHLORIDE 0.9 % IV BOLUS (SEPSIS)
1000.0000 mL | Freq: Once | INTRAVENOUS | Status: AC
  Administered 2012-06-21: 1000 mL via INTRAVENOUS

## 2012-06-21 MED ORDER — DEXAMETHASONE SODIUM PHOSPHATE 10 MG/ML IJ SOLN
10.0000 mg | Freq: Once | INTRAMUSCULAR | Status: AC
Start: 2012-06-21 — End: 2012-06-21
  Administered 2012-06-21: 10 mg via INTRAVENOUS
  Filled 2012-06-21: qty 1

## 2012-06-21 MED ORDER — HYDROMORPHONE HCL PF 1 MG/ML IJ SOLN
1.0000 mg | Freq: Once | INTRAMUSCULAR | Status: AC
  Administered 2012-06-21: 1 mg via INTRAVENOUS
  Filled 2012-06-21: qty 1

## 2012-06-21 NOTE — ED Notes (Addendum)
Pt reports a pounding headache and a sore throat that hurts when she swallows that started today. Denies SOB and chest pain.

## 2012-06-21 NOTE — ED Notes (Signed)
Per patient, cold symptoms for 2 days- sore throat, headache, bodyaches

## 2012-06-24 NOTE — ED Provider Notes (Signed)
History    42 year old female with sore throat and headache. Symptom onset last night and progressively worsening. Headache is diffuse and throbbing in nature. Subjective fever. No nausea or vomiting. No rash. No urinary complaints. Chronic cough. Patient is a smoker. Denies any acute change. No intervention prior to arrival.  CSN: [id number]  Arrival date & time 06/21/12  1324   First MD Initiated Contact with Patient 06/21/12 1341      Chief Complaint  Patient presents with  . Sore Throat    (Consider location/radiation/quality/duration/timing/severity/associated sxs/prior treatment) HPI  Past Medical History  Diagnosis Date  . Addison disease   . Hypertension     Past Surgical History  Procedure Laterality Date  . Back surgery      No family history on file.  History  Substance Use Topics  . Smoking status: Current Every Day Smoker -- 1.00 packs/day    Types: Cigarettes  . Smokeless tobacco: Not on file  . Alcohol Use: No    OB History   Grav Para Term Preterm Abortions TAB SAB Ect Mult Living                  Review of Systems  Allergies  Codeine  Home Medications   Current Outpatient Rx  Name  Route  Sig  Dispense  Refill  . Aspirin-Salicylamide-Caffeine (BC HEADACHE) 325-95-16 MG TABS   Oral   Take 1 packet by mouth every 6 (six) hours as needed (for pain).         Marland Kitchen diphenhydrAMINE (BENADRYL) 25 mg capsule   Oral   Take 25 mg by mouth every 6 (six) hours as needed for itching.         . methadone (DOLOPHINE) 10 MG/ML solution   Oral   Take 90 mg by mouth daily.           BP 110/63  Pulse 82  Temp(Src) 99.4 F (37.4 C) (Oral)  Resp 16  SpO2 93%  LMP 06/18/2012  Physical Exam  Nursing note and vitals reviewed. Constitutional: She appears well-developed and well-nourished. No distress.  HENT:  Head: Normocephalic and atraumatic.  Mouth/Throat: Oropharyngeal exudate present.  Bilateral tonsillar hypertrophy with exudate. Uvula  is midline. Handling secretions. Normal sounding phonation. Neck is supple. Tender right-sided anterior cervical adenopathy. Submental tissues are soft. There is no stridor.  Eyes: Conjunctivae are normal. Right eye exhibits no discharge. Left eye exhibits no discharge.  Neck: Neck supple. No tracheal deviation present.  Cardiovascular: Normal rate, regular rhythm and normal heart sounds.  Exam reveals no gallop and no friction rub.   No murmur heard. Pulmonary/Chest: Effort normal and breath sounds normal. No stridor. No respiratory distress.  Abdominal: Soft. She exhibits no distension. There is no tenderness.  Musculoskeletal: She exhibits no edema and no tenderness.  Lymphadenopathy:    She has cervical adenopathy.  Neurological: She is alert.  Skin: Skin is warm and dry.  Psychiatric: She has a normal mood and affect. Her behavior is normal. Thought content normal.    ED Course  Procedures (including critical care time)  Labs Reviewed - No data to display No results found.   1. Strep pharyngitis       MDM  42 year old female with sore throat. 3-4 out of 4 centor criteria for strep pharyngitis. She does have a cough, but smoker and chronic in nature. No acute change. No evidence of airway compromise. Patient was treated with the IM injection of Bicillin. IV fluids and pain medicines.  Discharge with continued symptomatic control. Return precautions discussed.        Raeford Razor, MD 06/24/12 1425

## 2012-06-25 ENCOUNTER — Encounter (HOSPITAL_COMMUNITY): Payer: Self-pay | Admitting: *Deleted

## 2012-06-25 ENCOUNTER — Telehealth (HOSPITAL_COMMUNITY): Payer: Self-pay | Admitting: Emergency Medicine

## 2012-06-25 ENCOUNTER — Emergency Department (HOSPITAL_COMMUNITY)
Admission: EM | Admit: 2012-06-25 | Discharge: 2012-06-25 | Disposition: A | Discharge status: Home / Self Care | Payer: Self-pay | Attending: Emergency Medicine | Admitting: Emergency Medicine

## 2012-06-25 DIAGNOSIS — R52 Pain, unspecified: Secondary | ICD-10-CM | POA: Insufficient documentation

## 2012-06-25 DIAGNOSIS — B349 Viral infection, unspecified: Secondary | ICD-10-CM

## 2012-06-25 DIAGNOSIS — Z79899 Other long term (current) drug therapy: Secondary | ICD-10-CM | POA: Insufficient documentation

## 2012-06-25 DIAGNOSIS — B9789 Other viral agents as the cause of diseases classified elsewhere: Secondary | ICD-10-CM | POA: Insufficient documentation

## 2012-06-25 DIAGNOSIS — F17200 Nicotine dependence, unspecified, uncomplicated: Secondary | ICD-10-CM | POA: Insufficient documentation

## 2012-06-25 DIAGNOSIS — I10 Essential (primary) hypertension: Secondary | ICD-10-CM | POA: Insufficient documentation

## 2012-06-25 LAB — CBC WITH DIFFERENTIAL/PLATELET
Basophils Absolute: 0 10*3/uL (ref 0.0–0.1)
Eosinophils Relative: 2 % (ref 0–5)
HCT: 36.6 % (ref 36.0–46.0)
Lymphocytes Relative: 47 % — ABNORMAL HIGH (ref 12–46)
Lymphs Abs: 3.3 10*3/uL (ref 0.7–4.0)
MCV: 80.4 fL (ref 78.0–100.0)
Monocytes Absolute: 0.5 10*3/uL (ref 0.1–1.0)
Neutro Abs: 3 10*3/uL (ref 1.7–7.7)
Platelets: 321 10*3/uL (ref 150–400)
RBC: 4.55 MIL/uL (ref 3.87–5.11)
RDW: 16.4 % — ABNORMAL HIGH (ref 11.5–15.5)
WBC: 7 10*3/uL (ref 4.0–10.5)

## 2012-06-25 LAB — COMPREHENSIVE METABOLIC PANEL
ALT: 15 U/L (ref 0–35)
AST: 14 U/L (ref 0–37)
CO2: 27 mEq/L (ref 19–32)
Calcium: 8.8 mg/dL (ref 8.4–10.5)
Chloride: 101 mEq/L (ref 96–112)
GFR calc Af Amer: >90 mL/min (ref >90)
GFR calc non Af Amer: 86 mL/min — ABNORMAL LOW (ref >90)
Glucose, Bld: 88 mg/dL (ref 70–99)
Sodium: 136 mEq/L (ref 135–145)
Total Bilirubin: 0.1 mg/dL — ABNORMAL LOW (ref 0.3–1.2)

## 2012-06-25 MED ORDER — KETOROLAC TROMETHAMINE 60 MG/2ML IM SOLN
60.0000 mg | Freq: Once | INTRAMUSCULAR | Status: AC
  Administered 2012-06-25: 60 mg via INTRAMUSCULAR
  Filled 2012-06-25: qty 2

## 2012-06-25 NOTE — ED Provider Notes (Signed)
History     CSN: [id number]  Arrival date & time 06/25/12  1643   First MD Initiated Contact with Patient 06/25/12 1651      Chief Complaint  Patient presents with  . Pain    Body  . Headache    (Consider location/radiation/quality/duration/timing/severity/associated sxs/prior treatment) HPI Comments: Patient was diagnosed with strep throat 4 days ago and treated with bicillin.  She seemed to improve, then started yesterday with headache, "pain all over".  All the muscles, bones, and joints of her body hurt.  No fevers or chills.  No stiff neck.  Patient is a 42 y.o. female presenting with headaches. The history is provided by the patient.  Headache Pain location:  Generalized Quality:  Stabbing Radiates to:  Does not radiate Duration:  2 days Timing:  Constant Progression:  Worsening Chronicity:  New Similar to prior headaches: no   Context: activity   Relieved by:  Nothing Ineffective treatments:  Acetaminophen and NSAIDs Associated symptoms: no abdominal pain and no fever     Past Medical History  Diagnosis Date  . Addison disease   . Hypertension     Past Surgical History  Procedure Laterality Date  . Back surgery      History reviewed. No pertinent family history.  History  Substance Use Topics  . Smoking status: Current Every Day Smoker -- 1.00 packs/day    Types: Cigarettes  . Smokeless tobacco: Never Used  . Alcohol Use: No    OB History   Grav Para Term Preterm Abortions TAB SAB Ect Mult Living                  Review of Systems  Constitutional: Negative for fever.  Gastrointestinal: Negative for abdominal pain.  Neurological: Positive for headaches.  All other systems reviewed and are negative.    Allergies  Codeine  Home Medications   Current Outpatient Rx  Name  Route  Sig  Dispense  Refill  . Aspirin-Salicylamide-Caffeine (BC HEADACHE) 325-95-16 MG TABS   Oral   Take 1 packet by mouth every 6 (six) hours as needed (for  pain).         Marland Kitchen diphenhydrAMINE (BENADRYL) 25 mg capsule   Oral   Take 25 mg by mouth every 6 (six) hours as needed for itching.         . methadone (DOLOPHINE) 10 MG/ML solution   Oral   Take 90 mg by mouth daily.           BP 140/78  Pulse 74  Temp(Src) 98.5 F (36.9 C) (Oral)  Resp 16  Ht 5\' 5"  (1.651 m)  SpO2 100%  LMP 06/18/2012  Physical Exam  Nursing note and vitals reviewed. Constitutional: She is oriented to person, place, and time. She appears well-developed and well-nourished. No distress.  HENT:  Head: Normocephalic and atraumatic.  Mouth/Throat: Oropharynx is clear and moist. No oropharyngeal exudate.  Bilateral TM's are clear.  Eyes: Pupils are equal, round, and reactive to light.  Neck: Normal range of motion. Neck supple.  Cardiovascular: Normal rate and regular rhythm.  Exam reveals no gallop and no friction rub.   No murmur heard. Pulmonary/Chest: Effort normal and breath sounds normal. No respiratory distress. She has no wheezes.  Abdominal: Soft. Bowel sounds are normal. She exhibits no distension. There is no tenderness.  Musculoskeletal: Normal range of motion.  Neurological: She is alert and oriented to person, place, and time.  Skin: Skin is warm and dry. She is  not diaphoretic.    ED Course  Procedures (including critical care time)  Labs Reviewed  CBC WITH DIFFERENTIAL  COMPREHENSIVE METABOLIC PANEL   No results found.   No diagnosis found.    MDM  The patient presents here with "pain all over" since yesterday.  She was diagnosed with strep throat 4 days ago and treated with bicillin.  The cmp is unremarkable and there is no wbc count.  There is a lymphocytosis that makes me believe there may be a viral component to her symptoms.  I will recommend ibuprofen, return prn.        Geoffery Lyons, MD 06/25/12 [phone]

## 2012-06-25 NOTE — ED Notes (Signed)
Pt c/o headache and "skin/tissue" pain.  Sts "it isn't my muscles or bones."  Sts she was Dx with Strep Throat x 4 days ago.

## 2012-06-25 NOTE — ED Notes (Signed)
Pt c/o generalized body pain, sts mostly "on my trunk". Pt sts it's burning and she is unable to move from the pain. No redness or swelling noted on body.

## 2012-06-25 NOTE — ED Notes (Signed)
Rcvd pt signed consent for release of information from [facility] requesting Info from pt's 06/21/12 visit to ED.  ED Referral Summary faxed to [facility] Attn.

## 2012-06-25 NOTE — ED Notes (Signed)
Pt from home with reports of headache and body pain. Pt denies N/V/D.

## 2013-03-24 ENCOUNTER — Encounter (HOSPITAL_COMMUNITY): Payer: Self-pay | Admitting: Emergency Medicine

## 2013-03-24 ENCOUNTER — Emergency Department (HOSPITAL_COMMUNITY)
Admission: EM | Admit: 2013-03-24 | Discharge: 2013-03-24 | Disposition: A | Discharge status: Home / Self Care | Payer: Self-pay | Attending: Emergency Medicine | Admitting: Emergency Medicine

## 2013-03-24 DIAGNOSIS — K002 Abnormalities of size and form of teeth: Secondary | ICD-10-CM | POA: Insufficient documentation

## 2013-03-24 DIAGNOSIS — F17200 Nicotine dependence, unspecified, uncomplicated: Secondary | ICD-10-CM | POA: Insufficient documentation

## 2013-03-24 DIAGNOSIS — Z862 Personal history of diseases of the blood and blood-forming organs and certain disorders involving the immune mechanism: Secondary | ICD-10-CM | POA: Insufficient documentation

## 2013-03-24 DIAGNOSIS — K089 Disorder of teeth and supporting structures, unspecified: Secondary | ICD-10-CM | POA: Insufficient documentation

## 2013-03-24 DIAGNOSIS — Z8639 Personal history of other endocrine, nutritional and metabolic disease: Secondary | ICD-10-CM | POA: Insufficient documentation

## 2013-03-24 DIAGNOSIS — K0381 Cracked tooth: Secondary | ICD-10-CM | POA: Insufficient documentation

## 2013-03-24 DIAGNOSIS — G8929 Other chronic pain: Secondary | ICD-10-CM | POA: Insufficient documentation

## 2013-03-24 DIAGNOSIS — I10 Essential (primary) hypertension: Secondary | ICD-10-CM | POA: Insufficient documentation

## 2013-03-24 DIAGNOSIS — Z791 Long term (current) use of non-steroidal anti-inflammatories (NSAID): Secondary | ICD-10-CM | POA: Insufficient documentation

## 2013-03-24 DIAGNOSIS — K0889 Other specified disorders of teeth and supporting structures: Secondary | ICD-10-CM

## 2013-03-24 DIAGNOSIS — K029 Dental caries, unspecified: Secondary | ICD-10-CM | POA: Insufficient documentation

## 2013-03-24 MED ORDER — HYDROCODONE-ACETAMINOPHEN 5-325 MG PO TABS
1.0000 | ORAL_TABLET | Freq: Once | ORAL | Status: AC
  Administered 2013-03-24: 1 via ORAL
  Filled 2013-03-24: qty 1

## 2013-03-24 MED ORDER — IBUPROFEN 800 MG PO TABS: 800.0000 mg | ORAL_TABLET | Freq: Three times a day (TID) | ORAL | Status: DC | PRN

## 2013-03-24 MED ORDER — HYDROCODONE-ACETAMINOPHEN 5-325 MG PO TABS: 1.0000 | ORAL_TABLET | ORAL | Status: DC | PRN

## 2013-03-24 MED ORDER — PENICILLIN V POTASSIUM 500 MG PO TABS: 500.0000 mg | ORAL_TABLET | Freq: Four times a day (QID) | ORAL | Status: DC

## 2013-03-24 NOTE — Discharge Instructions (Signed)
Read the information below.  Use the prescribed medication as directed.  Please discuss all new medications with your pharmacist.  Do not take additional tylenol while taking the prescribed pain medication to avoid overdose.  You may return to the Emergency Department at any time for worsening condition or any new symptoms that concern you.  Please call the dentist listed above within 48 hours to schedule a close follow up appointment.  If you develop fevers, swelling in your face, difficulty swallowing or breathing, return to the ER immediately for a recheck.   ° ° °Dental Pain °A tooth ache may be caused by cavities (tooth decay). Cavities expose the nerve of the tooth to air and hot or cold temperatures. It may come from an infection or abscess (also called a boil or furuncle) around your tooth. It is also often caused by dental caries (tooth decay). This causes the pain you are having. °DIAGNOSIS  °Your caregiver can diagnose this problem by exam. °TREATMENT  °· If caused by an infection, it may be treated with medications which kill germs (antibiotics) and pain medications as prescribed by your caregiver. Take medications as directed. °· Only take over-the-counter or prescription medicines for pain, discomfort, or fever as directed by your caregiver. °· Whether the tooth ache today is caused by infection or dental disease, you should see your dentist as soon as possible for further care. °SEEK MEDICAL CARE IF: °The exam and treatment you received today has been provided on an emergency basis only. This is not a substitute for complete medical or dental care. If your problem worsens or new problems (symptoms) appear, and you are unable to meet with your dentist, call or return to this location. °SEEK IMMEDIATE MEDICAL CARE IF:  °· You have a fever. °· You develop redness and swelling of your face, jaw, or neck. °· You are unable to open your mouth. °· You have severe pain uncontrolled by pain medicine. °MAKE  SURE YOU:  °· Understand these instructions. °· Will watch your condition. °· Will get help right away if you are not doing well or get worse. °Document Released: 02/19/2005 Document Revised: 05/14/2011 Document Reviewed: 10/08/2007 °ExitCare® Patient Information ©2014 [employer], LLC. ° ° °Emergency Department Resource Guide °1) Find a Doctor and Pay Out of Pocket °Although you won't have to find out who is covered by your insurance plan, it is a good idea to ask around and get recommendations. You will then need to call the office and see if the doctor you have chosen will accept you as a new patient and what types of options they offer for patients who are self-pay. Some doctors offer discounts or will set up payment plans for their patients who do not have insurance, but you will need to ask so you aren't surprised when you get to your appointment. ° °2) Contact Your Local Health Department °Not all health departments have doctors that can see patients for sick visits, but many do, so it is worth a call to see if yours does. If you don't know where your local health department is, you can check in your phone book. The CDC also has a tool to help you locate your state's health department, and many state websites also have listings of all of their local health departments. ° °3) Find a Walk-in Clinic °If your illness is not likely to be very severe or complicated, you may want to try a walk in clinic. These are popping up all over the   in pharmacies, drugstores, and shopping centers. They're usually staffed by nurse practitioners or physician assistants that have been trained to treat common illnesses and complaints. They're usually fairly quick and inexpensive. However, if you have serious medical issues or chronic medical problems, these are probably not your best option.  No Primary Care Doctor: - Call Health Connect at  [phone] - they can help you locate a primary care doctor that  accepts your insurance,  provides certain services, etc. - Physician Referral Service- [phone]  Chronic Pain Problems: Organization         Address  Phone   Notes  [facility]  [phone] Patients need to be referred by their primary care doctor.   Medication Assistance: Organization         Address  Phone   Notes  [facility] [address] [place], [place] [address] [phone] --Must be a resident of [place] [facility] -- Must have NO insurance coverage whatsoever (no Medicaid/ Medicare, etc.) -- The pt. MUST have a primary care doctor that directs their care regularly and follows them in the community   Med[facility]ist  [phone]   [employer]  [phone]    Agencies that provide inexpensive medical care: Organization         Address  Phone   Notes  [facility]  [phone]   [facility]    [phone]   [facility] [address] [place], [place] [address] [phone]   [facility] [address], [place] [phone]   [facility]    [phone]   [facility]    [phone]   [facility]  [address], [place] Phone:  [phone], Fax:  [phone] Hours of Operation:  9 am - 6 pm, M-F.  Also accepts Medicaid/Medicare and self-pay.  [facility]  [address], [place] Phone: [phone], Fax: [phone]. Hours of Operation:  8:30 am - 5:30 pm, M-F.  Also accepts Medicaid and self-pay.  [facility] [place] [address], [place] Phone: [phone]   [facility] [address], [place], [place] [phone], Ext. [phone] Mondays & Thursdays: 7-9 AM.  First 15 patients are seen on a first come, first serve basis.    Medicaid-accepting [facility] Providers:  Organization         Address  Phone    Notes  [facility] [address], [place] [phone] Also accepts self-pay patients.  [facility] [address], [place]  [phone]   [facility] [address], [place] [phone]   [facility] [address] [place] [address], [place] [phone]   Renaye RakersVeita Bland [address], [place]   [phone] Only accepts [place] patients after they have their name applied to their card.   Self-Pay (no insurance) in [facility]:  Organization         Address  Phone   Notes  Sickle Cell Patients, [facility] [address], [place] [phone]   [facility] [address], [place] [phone]   [facility] [place]  [address], [place] [phone]   °[facility]/Dr. Osei-Bonsu ° [address] [place] [address], [place] or [address], [place] [phone] Phone number for both [place] and [place] locations is the same.  °[facility] [address], [place] [phone]   °[facility] [address] [place] [address], [place] or [address] [phone] °[phone]   °[facility] [address], [place] [phone], phone; [phone], fax Sees patients 1st and 3rd Saturday of every month.  Must not qualify for public or private insurance (i.e. Medicaid, Medicare, [place] Health Choice, Veterans' Benefits) • Household income should be no more than 200% of the poverty level •The clinic cannot treat you if you are pregnant or think you are pregnant • Sexually transmitted diseases are not treated at the clinic.  ° ° °Dental Care: °Organization         Address  Phone  Notes  °[facility] [address],  [place] [phone] Accepts children up to age 21 who are enrolled in Medicaid or [place] Health Choice; pregnant women with a Medicaid card; and children who have applied for Medicaid or [place] Health Choice, but were declined, whose parents can pay a reduced fee at time of service.  °[facility] [place]  [address], [place] [phone] Accepts children up to age 21 who are enrolled in Medicaid or [place] Health Choice; pregnant women with a Medicaid card; and children who have applied for Medicaid or [place] Health Choice, but were declined, whose parents can pay a reduced fee at time of service.  °[facility] ° [address], [place] [phone] Patients are seen by appointment only. Walk-ins are not accepted. [facility] will see patients 18 years of age and older. °Monday - Tuesday (8am-5pm) °Most Wednesdays (8:30-5pm) °$30 per visit, cash only  °[facility] ° [address], [place] [phone] Patients are seen by appointment only. Walk-ins are not accepted. [facility] will see patients 18 years of age and older. °One Wednesday Evening (Monthly: Volunteer Based).  $30 per visit, cash only  °[facility]  [phone] for adults; Children under age 4, call Graduate Pediatric Dentistry at [phone]. Children aged 4-14, please call [phone] to request a pediatric application. ° Dental services are provided in all areas of dental care including fillings, crowns and bridges, complete and partial dentures, implants, gum treatment, root canals, and extractions. Preventive care is also provided. Treatment is provided to both adults and children. °Patients are selected via a lottery and there is often a waiting list. °  °[facility] [address], °[place] ° ([phone]) [phone] [email or web address] °  °[facility] [address], [place], [place]  [phone], Ext. [phone] Second and Fourth Thursday of each month, opens at 6:30 AM; Clinic ends at 9 AM.  Patients are seen on a first-come first-served basis, and a limited number are seen during each clinic.  ° °[facility] ° [address], [place], [place] [phone]   Eligibility Requirements °You must have lived in [place], [place], or [place] counties for at least the last three months. °  You cannot be eligible for state or federal sponsored healthcare insurance, including Veterans Administration, Medicaid, or Medicare. °  You   generally cannot be eligible for healthcare insurance through your employer.  °  How to apply: °Eligibility screenings are held every Tuesday and Wednesday afternoon from 1:00 pm until 4:00 pm. You do not need an appointment for the interview!  °[facility] [address], [place], [place] [phone]   °[facility]  336-342-8273   °[facility]  336-703-3100   °[facility]  336-570-6415   ° °Behavioral Health Resources in the Community: °Intensive Outpatient Programs °Organization         Address  Phone  Notes  °[facility] [address], [place], [place] [phone]   °[facility] [address], [place], [place] [phone]   °[facility] [address], [place], [place] ° 336-882-2125   °[facility] [address],  °[place], [place] 1-800-853-5163 or 336-641-4981   °Substance Abuse Resources °Organization         Address  Phone  Notes  °[facility]  336-882-2125   °[facility]  336-784-9470   °[facility]  336-285-9073   °[facility]  336-845-3988   °Residential & Outpatient Substance Abuse Program  1-800-659-3381   °Psychological Services °Organization         Address  Phone  Notes  °[facility]  336- 832-9600   °[facility]  336- 378-7881    °[facility] [address], [place] 1-800-853-5163 or 336-641-4981   ° °Mobile Crisis Teams °Organization         Address  Phone  Notes  °[facility], Mobile Crisis Care Unit  1-877-626-1772   °Assertive °[facility] ° [address] [place], [place] 336-834-9664   °Sharon DeEsch [address] °[place] 336-554-5454   ° °Self-Help/Support Groups °Organization         Address  Phone             Notes  °[facility] [place] - variety of support groups  336- 373-1402 Call for more information  °Narcotics Anonymous (NA), [facility] [address], °[place]  2 meetings at this location  ° °Residential Treatment Programs °Organization         Address  Phone  Notes  °[facility] [address],    °[place]  1-866-801-8205   °[facility] ° [address], [place], [place] 704-293-8524   °[facility] [address], [place] 336-845-3988 Admissions: 8am-3pm M-F  °[facility] [address]    °[place], [place] 336-841-1104   °[facility] [address], [place], [place] 336-379-7146   °[facility] [address]  °[place], [place] 336-285-9073   °[facility] Intensive Outpatient [address], [place], [place] 336-852-3033   °[facility] ([facility] [address]  °[place], [place] 1-877-615-2722 or 336-784-9470   °[facility] ([facility]) [address] [place], [place] 336-227-7417 Accepts Medicaid  °[facility] [address]  ° [place] 1-800-659-3381 Substance Abuse/Addiction Treatment  ° °[place] Behavior[facility] Health Resources °Organization         Address  Phone  Notes  °[facility]  (888) 581-9988   °Julie Brannon, PhD [address] [place], [place]   (336) 349-5553 or (336) 951-0000   °[facility]   [address]    [place], [place] [phone]   [facility] [address] [place], [place] [phone] Insurance/Medicaid/sponsorship through [facility] and Families [address], [address] [place], [place] [phone] [place], [place] [phone]    Dr. Adele Schilder  [phone]   [facility] 1) [address], [place] 2) [place] 3)  [place], [place] [phone]  [phone]   [place] [phone] or [phone] (After Hours)

## 2013-03-24 NOTE — Progress Notes (Signed)
[facility] did not get to see patient but will be sending information about the [facility] Orange Card, using the address provided.  °

## 2013-03-24 NOTE — ED Provider Notes (Signed)
CSN: [id number]     Arrival date & time 03/24/13  1132 History   First MD Initiated Contact with Patient 03/24/13 1144     Chief Complaint  Patient presents with  . Dental Pain   (Consider location/radiation/quality/duration/timing/severity/associated sxs/prior Treatment) HPI Patient with chronically decayed teeth and remote fracture of right upper tooth presents with acute worsening of chronic dental pain.  Pain is now severe, worse with hot and cold beverages/food.  Pain is over right upper and lower dentition.  Denies fevers, sore throat, difficulty swallowing and breathing.  Has taken ibuprofen without improvement.    Past Medical History  Diagnosis Date  . Addison disease   . Hypertension    Past Surgical History  Procedure Laterality Date  . Back surgery     No family history on file. History  Substance Use Topics  . Smoking status: Current Every Day Smoker -- 1.00 packs/day    Types: Cigarettes  . Smokeless tobacco: Never Used  . Alcohol Use: No   OB History   Grav Para Term Preterm Abortions TAB SAB Ect Mult Living                 Review of Systems  Constitutional: Negative for fever and chills.  HENT: Positive for dental problem. Negative for sore throat and trouble swallowing.   Respiratory: Negative for shortness of breath.   Musculoskeletal: Negative for myalgias.  Skin: Negative for color change.    Allergies  Codeine  Home Medications   Current Outpatient Rx  Name  Route  Sig  Dispense  Refill  . naproxen sodium (ANAPROX) 220 MG tablet   Oral   Take 440 mg by mouth 2 (two) times daily with a meal.         . HYDROcodone-acetaminophen (NORCO/VICODIN) 5-325 MG per tablet   Oral   Take 1 tablet by mouth every 4 (four) hours as needed.   15 tablet   0   . ibuprofen (ADVIL,MOTRIN) 800 MG tablet   Oral   Take 1 tablet (800 mg total) by mouth every 8 (eight) hours as needed.   21 tablet   0   . penicillin v potassium (VEETID) 500 MG tablet  Oral   Take 1 tablet (500 mg total) by mouth 4 (four) times daily.   40 tablet   0    BP 128/71  Pulse 89  Temp(Src) 98.5 F (36.9 C) (Oral)  Resp 16  SpO2 100%  LMP 03/17/2013 Physical Exam  Nursing note and vitals reviewed. Constitutional: She appears well-developed and well-nourished. No distress.  HENT:  Head: Normocephalic and atraumatic.  Mouth/Throat: Uvula is midline and oropharynx is clear and moist. Mucous membranes are not dry. Abnormal dentition. Dental caries present. No uvula swelling. No oropharyngeal exudate, posterior oropharyngeal edema, posterior oropharyngeal erythema or tonsillar abscesses.    Poor dentition of remaining dentition.  Many molars missing.   Neck: Neck supple.  Pulmonary/Chest: Effort normal.  Neurological: She is alert.  Skin: She is not diaphoretic.    ED Course  Procedures (including critical care time) Labs Review Labs Reviewed - No data to display Imaging Review No results found.  EKG Interpretation   None       MDM   1. Pain, dental    Pt with chronic dental decay with acute worsening of pain.  No obvious dental abscess.  Will d/c with penicillin, ibuprofen, norco.  Dental follow up.  Discussed findings, treatment, and follow up  with patient.  Pt  given return precautions.  Pt verbalizes understanding and agrees with plan.        Trixie Dredgemily Yeiren [place], PA-C 03/24/13 1249

## 2013-03-24 NOTE — ED Notes (Signed)
Pt c/o toothache since yesterday.  States that she cannot get in to see a dentist.

## 2013-03-27 NOTE — ED Provider Notes (Signed)
Medical screening examination/treatment/procedure(s) were performed by non-physician practitioner and as supervising physician I was immediately available for consultation/collaboration.  EKG Interpretation   None        Brisha Mccabe T Britain Saber, MD 03/27/13 0812 

## 2013-04-03 ENCOUNTER — Emergency Department (HOSPITAL_COMMUNITY)
Admission: EM | Admit: 2013-04-03 | Discharge: 2013-04-03 | Disposition: A | Discharge status: Home / Self Care | Payer: Self-pay | Attending: Emergency Medicine | Admitting: Emergency Medicine

## 2013-04-03 ENCOUNTER — Encounter (HOSPITAL_COMMUNITY): Payer: Self-pay | Admitting: Emergency Medicine

## 2013-04-03 DIAGNOSIS — Z791 Long term (current) use of non-steroidal anti-inflammatories (NSAID): Secondary | ICD-10-CM | POA: Insufficient documentation

## 2013-04-03 DIAGNOSIS — G43909 Migraine, unspecified, not intractable, without status migrainosus: Secondary | ICD-10-CM | POA: Insufficient documentation

## 2013-04-03 DIAGNOSIS — I10 Essential (primary) hypertension: Secondary | ICD-10-CM | POA: Insufficient documentation

## 2013-04-03 DIAGNOSIS — F17200 Nicotine dependence, unspecified, uncomplicated: Secondary | ICD-10-CM | POA: Insufficient documentation

## 2013-04-03 DIAGNOSIS — R209 Unspecified disturbances of skin sensation: Secondary | ICD-10-CM | POA: Insufficient documentation

## 2013-04-03 DIAGNOSIS — E2749 Other adrenocortical insufficiency: Secondary | ICD-10-CM | POA: Insufficient documentation

## 2013-04-03 MED ORDER — KETOROLAC TROMETHAMINE 30 MG/ML IJ SOLN
30.0000 mg | Freq: Once | INTRAMUSCULAR | Status: AC
  Administered 2013-04-03: 30 mg via INTRAVENOUS
  Filled 2013-04-03: qty 1

## 2013-04-03 MED ORDER — METOCLOPRAMIDE HCL 5 MG/ML IJ SOLN
10.0000 mg | Freq: Once | INTRAMUSCULAR | Status: AC
  Administered 2013-04-03: 10 mg via INTRAVENOUS
  Filled 2013-04-03: qty 2

## 2013-04-03 MED ORDER — SODIUM CHLORIDE 0.9 % IV SOLN
Freq: Once | INTRAVENOUS | Status: AC
  Administered 2013-04-03: 18:00:00 via INTRAVENOUS

## 2013-04-03 MED ORDER — DIPHENHYDRAMINE HCL 50 MG/ML IJ SOLN
12.5000 mg | Freq: Once | INTRAMUSCULAR | Status: AC
  Administered 2013-04-03: 12.5 mg via INTRAVENOUS
  Filled 2013-04-03: qty 1

## 2013-04-03 NOTE — ED Provider Notes (Signed)
Medical screening examination/treatment/procedure(s) were performed by non-physician practitioner and as supervising physician I was immediately available for consultation/collaboration.  EKG Interpretation   None         Kristen N Ward, DO 04/03/13 1920 

## 2013-04-03 NOTE — ED Notes (Signed)
Pt from home c/o migraine x3 days with L hand numbness. Pt denies N/V. Pt reports that she has extreme pressure on her head, but not sensitive to light/sound. Pt has taken OTC meds with no relief. Pt is A&O and in NAD.

## 2013-04-03 NOTE — ED Notes (Signed)
Pt states this does not feel like a typical migraine and that it feels like her "brain is going to push out of her skull"

## 2013-04-03 NOTE — Discharge Instructions (Signed)
Migraine Headache A migraine headache is an intense, throbbing pain on one or both sides of your head. A migraine can last for 30 minutes to several hours. CAUSES  The exact cause of a migraine headache is not always known. However, a migraine may be caused when nerves in the brain become irritated and release chemicals that cause inflammation. This causes pain. Certain things may also trigger migraines, such as:  Alcohol.  Smoking.  Stress.  Menstruation.  Aged cheeses.  Foods or drinks that contain nitrates, glutamate, aspartame, or tyramine.  Lack of sleep.  Chocolate.  Caffeine.  Hunger.  Physical exertion.  Fatigue.  Medicines used to treat chest pain (nitroglycerine), birth control pills, estrogen, and some blood pressure medicines. SIGNS AND SYMPTOMS  Pain on one or both sides of your head.  Pulsating or throbbing pain.  Severe pain that prevents daily activities.  Pain that is aggravated by any physical activity.  Nausea, vomiting, or both.  Dizziness.  Pain with exposure to bright lights, loud noises, or activity.  General sensitivity to bright lights, loud noises, or smells. Before you get a migraine, you may get warning signs that a migraine is coming (aura). An aura may include:  Seeing flashing lights.  Seeing bright spots, halos, or zig-zag lines.  Having tunnel vision or blurred vision.  Having feelings of numbness or tingling.  Having trouble talking.  Having muscle weakness. DIAGNOSIS  A migraine headache is often diagnosed based on:  Symptoms.  Physical exam.  A CT scan or MRI of your head. These imaging tests cannot diagnose migraines, but they can help rule out other causes of headaches. TREATMENT Medicines may be given for pain and nausea. Medicines can also be given to help prevent recurrent migraines.  HOME CARE INSTRUCTIONS  Only take over-the-counter or prescription medicines for pain or discomfort as directed by your  health care provider. The use of long-term narcotics is not recommended.  Lie down in a dark, quiet room when you have a migraine.  Keep a journal to find out what may trigger your migraine headaches. For example, write down:  What you eat and drink.  How much sleep you get.  Any change to your diet or medicines.  Limit alcohol consumption.  Quit smoking if you smoke.  Get 7 9 hours of sleep, or as recommended by your health care provider.  Limit stress.  Keep lights dim if bright lights bother you and make your migraines worse. SEEK IMMEDIATE MEDICAL CARE IF:   Your migraine becomes severe.  You have a fever.  You have a stiff neck.  You have vision loss.  You have muscular weakness or loss of muscle control.  You start losing your balance or have trouble walking.  You feel faint or pass out.  You have severe symptoms that are different from your first symptoms. MAKE SURE YOU:   Understand these instructions.  Will watch your condition.  Will get help right away if you are not doing well or get worse. Document Released: 02/19/2005 Document Revised: 12/10/2012 Document Reviewed: 10/27/2012 ExitCare Patient Information 2014 [employer].  

## 2013-04-03 NOTE — ED Provider Notes (Signed)
CSN: [id number]     Arrival date & time 04/03/13  1727 History   First MD Initiated Contact with Patient 04/03/13 1744     Chief Complaint  Patient presents with  . Migraine   (Consider location/radiation/quality/duration/timing/severity/associated sxs/prior Treatment) Patient is a 43 y.o. female presenting with migraines. The history is provided by the patient. No language interpreter was used.  Migraine This is a new problem. The current episode started in the past 7 days. Associated symptoms include headaches. Pertinent negatives include no chills, fever, nausea or vomiting. Associated symptoms comments: Sharp headache at the top of her head, extending to back of head. She has a history of complicated migraines that has paresthesias of upper extremities, similar to current headache. No photophobia or nausea - also similar to previous migraines. She does say this pain is worse than usual..    Past Medical History  Diagnosis Date  . Addison disease   . Hypertension    Past Surgical History  Procedure Laterality Date  . Back surgery     No family history on file. History  Substance Use Topics  . Smoking status: Current Every Day Smoker -- 1.00 packs/day    Types: Cigarettes  . Smokeless tobacco: Never Used  . Alcohol Use: No   OB History   Grav Para Term Preterm Abortions TAB SAB Ect Mult Living                 Review of Systems  Constitutional: Negative for fever and chills.  Eyes: Negative for photophobia and visual disturbance.  Respiratory: Negative.   Cardiovascular: Negative.   Gastrointestinal: Negative for nausea and vomiting.  Musculoskeletal: Negative.   Skin: Negative.   Neurological: Positive for headaches.       Paresthesia of left arm.    Allergies  Codeine  Home Medications   Current Outpatient Rx  Name  Route  Sig  Dispense  Refill  . HYDROcodone-acetaminophen (NORCO/VICODIN) 5-325 MG per tablet   Oral   Take 1 tablet by mouth every 4 (four)  hours as needed.   15 tablet   0   . ibuprofen (ADVIL,MOTRIN) 800 MG tablet   Oral   Take 1 tablet (800 mg total) by mouth every 8 (eight) hours as needed.   21 tablet   0   . naproxen sodium (ANAPROX) 220 MG tablet   Oral   Take 440 mg by mouth 2 (two) times daily with a meal.         . penicillin v potassium (VEETID) 500 MG tablet   Oral   Take 1 tablet (500 mg total) by mouth 4 (four) times daily.   40 tablet   0    BP 130/82  Pulse 100  Temp(Src) 98 F (36.7 C) (Oral)  Resp 17  SpO2 100%  LMP 03/17/2013 Physical Exam  Constitutional: She is oriented to person, place, and time. She appears well-developed and well-nourished.  HENT:  Head: Normocephalic.  Eyes: Pupils are equal, round, and reactive to light.  Neck: Normal range of motion. Neck supple.  Cardiovascular: Normal rate and regular rhythm.   Pulmonary/Chest: Effort normal and breath sounds normal.  Abdominal: Soft. Bowel sounds are normal. There is no tenderness. There is no rebound and no guarding.  Musculoskeletal: Normal range of motion.  Neurological: She is alert and oriented to person, place, and time. She has normal strength and normal reflexes. No sensory deficit. She displays a negative Romberg sign. Coordination normal.  Skin: Skin is warm  and dry. No rash noted.  Psychiatric: She has a normal mood and affect.    ED Course  Procedures (including critical care time) Labs Review Labs Reviewed - No data to display Imaging Review No results found.  EKG Interpretation   None       MDM  No diagnosis found. 1. Complicated Migraine headache  Patient is feeling much better after headache cocktail of Reglan, Benadryl and Toradol. VSS. Stable for discharge.     Arnoldo Hooker, PA-C 04/03/13 1912

## 2013-11-04 ENCOUNTER — Encounter (HOSPITAL_COMMUNITY): Payer: Self-pay | Admitting: Emergency Medicine

## 2013-11-04 ENCOUNTER — Emergency Department (HOSPITAL_COMMUNITY)
Admission: EM | Admit: 2013-11-04 | Discharge: 2013-11-05 | Disposition: A | Discharge status: Home / Self Care | Payer: Self-pay | Attending: Emergency Medicine | Admitting: Emergency Medicine

## 2013-11-04 DIAGNOSIS — F17200 Nicotine dependence, unspecified, uncomplicated: Secondary | ICD-10-CM | POA: Insufficient documentation

## 2013-11-04 DIAGNOSIS — R112 Nausea with vomiting, unspecified: Secondary | ICD-10-CM | POA: Insufficient documentation

## 2013-11-04 DIAGNOSIS — Z8639 Personal history of other endocrine, nutritional and metabolic disease: Secondary | ICD-10-CM | POA: Insufficient documentation

## 2013-11-04 DIAGNOSIS — J029 Acute pharyngitis, unspecified: Secondary | ICD-10-CM | POA: Insufficient documentation

## 2013-11-04 DIAGNOSIS — J020 Streptococcal pharyngitis: Secondary | ICD-10-CM | POA: Insufficient documentation

## 2013-11-04 DIAGNOSIS — Z862 Personal history of diseases of the blood and blood-forming organs and certain disorders involving the immune mechanism: Secondary | ICD-10-CM | POA: Insufficient documentation

## 2013-11-04 DIAGNOSIS — Z792 Long term (current) use of antibiotics: Secondary | ICD-10-CM | POA: Insufficient documentation

## 2013-11-04 DIAGNOSIS — I10 Essential (primary) hypertension: Secondary | ICD-10-CM | POA: Insufficient documentation

## 2013-11-04 DIAGNOSIS — R52 Pain, unspecified: Secondary | ICD-10-CM | POA: Insufficient documentation

## 2013-11-04 LAB — RAPID STREP SCREEN (MED CTR MEBANE ONLY): STREPTOCOCCUS, GROUP A SCREEN (DIRECT): NEGATIVE

## 2013-11-04 NOTE — ED Notes (Signed)
Pt states she woke up with sore throat and generalized body aches at 330AM today. Pt states she had a fever of 102 earlier today, which has gone down after taking tylenol. Pt last took tylenol at ~730PM. Pt states she vomited once today when she woke up. Pt states her son has been sick for the past 2 days with similar symptoms. Pt A&Ox4 in NAD.

## 2013-11-05 ENCOUNTER — Emergency Department (HOSPITAL_COMMUNITY): Payer: Self-pay

## 2013-11-05 MED ORDER — TRAMADOL HCL 50 MG PO TABS: 50.0000 mg | ORAL_TABLET | Freq: Four times a day (QID) | ORAL | Status: DC | PRN

## 2013-11-05 MED ORDER — ONDANSETRON 4 MG PO TBDP
4.0000 mg | ORAL_TABLET | Freq: Once | ORAL | Status: AC
  Administered 2013-11-05: 4 mg via ORAL
  Filled 2013-11-05: qty 1

## 2013-11-05 MED ORDER — TRAMADOL HCL 50 MG PO TABS
50.0000 mg | ORAL_TABLET | Freq: Once | ORAL | Status: AC
  Administered 2013-11-05: 50 mg via ORAL
  Filled 2013-11-05: qty 1

## 2013-11-05 MED ORDER — PROMETHAZINE HCL 25 MG PO TABS: 25.0000 mg | ORAL_TABLET | Freq: Four times a day (QID) | ORAL | Status: DC | PRN

## 2013-11-05 MED ORDER — PENICILLIN G BENZATHINE 1200000 UNIT/2ML IM SUSP
1.2000 10*6.[IU] | Freq: Once | INTRAMUSCULAR | Status: AC
  Administered 2013-11-05: 1.2 10*6.[IU] via INTRAMUSCULAR
  Filled 2013-11-05: qty 2

## 2013-11-05 NOTE — ED Provider Notes (Signed)
Medical screening examination/treatment/procedure(s) were performed by non-physician practitioner and as supervising physician I was immediately available for consultation/collaboration.   EKG Interpretation None       Arleny Kruger M Shaquill Iseman, MD 11/05/13 0657 

## 2013-11-05 NOTE — Discharge Instructions (Signed)
Take Tramadol as needed for pain. Take Phenergan as needed for nausea. Refer to attached documents for more information.  °

## 2013-11-05 NOTE — ED Provider Notes (Signed)
CSN: [id number]     Arrival date & time 11/04/13  2043 History  This chart was scribed for non-physician practitioner, Emilia Beck  working with Olivia Mackie, MD by Luisa Dago, ED scribe. This patient was seen in room WTR8/WTR8 and the patient's care was started at 12:01 AM.    Chief Complaint  Patient presents with  . Generalized Body Aches  . Sore Throat    The history is provided by the patient. No language interpreter was used.   HPI Comments: Kristin Hickman is a 43 y.o. female who presents to the Emergency Department complaining of worsening generalized myalgias that started this morning. Pt reports one episode of emesis today. She is also complaining of associated fever and sore throat. Current ED temperature is 98.8. She denies any dizziness, cough, otalgia, or weakness.  Past Medical History  Diagnosis Date  . Addison disease   . Hypertension    Past Surgical History  Procedure Laterality Date  . Back surgery     No family history on file. History  Substance Use Topics  . Smoking status: Current Every Day Smoker -- 1.00 packs/day    Types: Cigarettes  . Smokeless tobacco: Never Used  . Alcohol Use: No   OB History   Grav Para Term Preterm Abortions TAB SAB Ect Mult Living                 Review of Systems  Constitutional: Positive for fever and chills. Negative for diaphoresis.  HENT: Positive for sore throat. Negative for congestion and ear pain.   Respiratory: Negative for cough.   Gastrointestinal: Positive for nausea and vomiting.  Musculoskeletal: Positive for myalgias.  Neurological: Negative for dizziness, weakness, numbness and headaches.   Allergies  Codeine  Home Medications   Prior to Admission medications   Medication Sig Start Date End Date Taking? Authorizing Provider  HYDROcodone-acetaminophen (NORCO/VICODIN) 5-325 MG per tablet Take 1 tablet by mouth every 4 (four) hours as needed for moderate pain.    Historical Provider, MD   ibuprofen (ADVIL,MOTRIN) 800 MG tablet Take 1 tablet (800 mg total) by mouth every 8 (eight) hours as needed. 03/24/13   Trixie Dredge, PA-C  penicillin v potassium (VEETID) 500 MG tablet Take 1 tablet (500 mg total) by mouth 4 (four) times daily. 03/24/13   Trixie Dredge, PA-C   BP 121/76  Pulse 91  Temp(Src) 98.8 F (37.1 C) (Oral)  Resp 18  SpO2 98%  LMP 10/21/2013  Physical Exam  Nursing note and vitals reviewed. Constitutional: She is oriented to person, place, and time. She appears well-developed and well-nourished. No distress.  HENT:  Head: Normocephalic and atraumatic.  Mouth/Throat: Oropharyngeal exudate present.  Bilateral tonsillar edema, erythema and exudate.   Eyes: Conjunctivae and EOM are normal.  Neck: Neck supple.  Cardiovascular: Normal rate and regular rhythm.  Exam reveals no gallop and no friction rub.   No murmur heard. Pulmonary/Chest: Effort normal and breath sounds normal. No respiratory distress. She has no wheezes. She has no rales. She exhibits no tenderness.  Abdominal: Soft. There is no tenderness.  Musculoskeletal: Normal range of motion.  Lymphadenopathy:    She has cervical adenopathy.  Neurological: She is alert and oriented to person, place, and time.  Speech is goal-oriented. Moves limbs without ataxia.   Skin: Skin is warm and dry.  Psychiatric: She has a normal mood and affect. Her behavior is normal.    ED Course  Procedures (including critical care time)  DIAGNOSTIC  STUDIES: Oxygen Saturation is 98% on RA, normal by my interpretation.    COORDINATION OF CARE: 12:04 AM- Will order a rapid strep test and a CXR. Pt advised of plan for treatment and pt agrees.  Labs Review Labs Reviewed  RAPID STREP SCREEN  CULTURE, GROUP A STREP    Imaging Review Dg Chest 2 View  11/05/2013   CLINICAL DATA:  Fever, sore throat, body aches.  EXAM: CHEST  2 VIEW  COMPARISON:  01/26/2011  FINDINGS: Cardiomediastinal contours within normal range. No  confluent airspace opacity, pleural effusion, or pneumothorax. No acute osseous finding.  IMPRESSION: No radiographic evidence of active cardiopulmonary disease.   Electronically Signed   By: Jearld Lesch M.D.   On: 11/05/2013 00:38     EKG Interpretation None      MDM   Final diagnoses:  Strep throat    1:05 AM Patient's rapid strep and chest xray negative. I will treat patient for strep based on clinical appearance. Vitals stable and patient afebrile. Patient will have IM pen G, tramadol and zofran.   I personally performed the services described in this documentation, which was scribed in my presence. The recorded information has been reviewed and is accurate.    Emilia Beck, PA-C 11/05/13 0109

## 2013-11-06 LAB — CULTURE, GROUP A STREP

## 2014-06-28 ENCOUNTER — Encounter (HOSPITAL_COMMUNITY): Payer: Self-pay

## 2014-06-28 ENCOUNTER — Emergency Department (HOSPITAL_COMMUNITY)
Admission: EM | Admit: 2014-06-28 | Discharge: 2014-06-28 | Disposition: A | Discharge status: Home / Self Care | Payer: Self-pay | Attending: Emergency Medicine | Admitting: Emergency Medicine

## 2014-06-28 DIAGNOSIS — Z720 Tobacco use: Secondary | ICD-10-CM | POA: Insufficient documentation

## 2014-06-28 DIAGNOSIS — Z792 Long term (current) use of antibiotics: Secondary | ICD-10-CM | POA: Insufficient documentation

## 2014-06-28 DIAGNOSIS — Z79899 Other long term (current) drug therapy: Secondary | ICD-10-CM | POA: Insufficient documentation

## 2014-06-28 DIAGNOSIS — I10 Essential (primary) hypertension: Secondary | ICD-10-CM | POA: Insufficient documentation

## 2014-06-28 DIAGNOSIS — K029 Dental caries, unspecified: Secondary | ICD-10-CM | POA: Insufficient documentation

## 2014-06-28 DIAGNOSIS — K006 Disturbances in tooth eruption: Secondary | ICD-10-CM | POA: Insufficient documentation

## 2014-06-28 DIAGNOSIS — Z791 Long term (current) use of non-steroidal anti-inflammatories (NSAID): Secondary | ICD-10-CM | POA: Insufficient documentation

## 2014-06-28 DIAGNOSIS — Z8639 Personal history of other endocrine, nutritional and metabolic disease: Secondary | ICD-10-CM | POA: Insufficient documentation

## 2014-06-28 MED ORDER — OXYCODONE-ACETAMINOPHEN 5-325 MG PO TABS: 1.0000 | ORAL_TABLET | Freq: Four times a day (QID) | ORAL | Status: DC | PRN

## 2014-06-28 MED ORDER — PENICILLIN V POTASSIUM 500 MG PO TABS: 500.0000 mg | ORAL_TABLET | Freq: Four times a day (QID) | ORAL | Status: AC

## 2014-06-28 MED ORDER — OXYCODONE-ACETAMINOPHEN 5-325 MG PO TABS
1.0000 | ORAL_TABLET | Freq: Once | ORAL | Status: AC
  Administered 2014-06-28: 1 via ORAL
  Filled 2014-06-28: qty 1

## 2014-06-28 MED ORDER — NAPROXEN 500 MG PO TABS: 500.0000 mg | ORAL_TABLET | Freq: Two times a day (BID) | ORAL | Status: DC

## 2014-06-28 NOTE — ED Notes (Signed)
Pt presents with c/o dental pain on the upper right side of her mouth that started three days ago. Pt reports her face has been swollen and she believes she has an abscess.

## 2014-06-28 NOTE — ED Notes (Signed)
md at bedside

## 2014-06-28 NOTE — ED Notes (Signed)
Pt escorted to discharge window. Pt verbalized understanding discharge instructions. In no acute distress.  

## 2014-06-28 NOTE — ED Provider Notes (Signed)
CSN: [id number]     Arrival date & time 06/28/14  [id number] History   First MD Initiated Contact with Patient 06/28/14 [phone]     Chief Complaint  Patient presents with  . Dental Pain    Patient is a 44 y.o. female presenting with tooth pain. The history is provided by the patient.  Dental Pain Location:  Upper Upper teeth location:  13/LU 2nd bicuspid Quality:  Dull Severity:  Severe Onset quality:  Gradual Duration:  3 days Timing:  Constant Progression:  Worsening Context: dental caries and poor dentition   Context: not trauma   Relieved by:  Nothing Worsened by:  Cold food/drink and touching Associated symptoms: facial pain and facial swelling   Associated symptoms: no congestion, no difficulty swallowing, no drooling, no fever, no oral bleeding and no trismus   Risk factors: lack of dental care     Past Medical History  Diagnosis Date  . Addison disease   . Hypertension    Past Surgical History  Procedure Laterality Date  . Back surgery     No family history on file. History  Substance Use Topics  . Smoking status: Current Every Day Smoker -- 1.00 packs/day    Types: Cigarettes  . Smokeless tobacco: Never Used  . Alcohol Use: No   OB History    No data available     Review of Systems  Constitutional: Negative for fever.  HENT: Positive for facial swelling. Negative for congestion and drooling.   All other systems reviewed and are negative.     Allergies  Codeine  Home Medications   Prior to Admission medications   Medication Sig Start Date End Date Taking? Authorizing Provider  Aspirin-Acetaminophen-Caffeine (GOODY HEADACHE PO) Take 1 each by mouth every 2 (two) hours as needed (dental pain.).   Yes Historical Provider, MD  ibuprofen (ADVIL,MOTRIN) 200 MG tablet Take 800 mg by mouth every 6 (six) hours as needed for headache or moderate pain.   Yes Historical Provider, MD  ibuprofen (ADVIL,MOTRIN) 800 MG tablet Take 1 tablet (800 mg total) by mouth every  8 (eight) hours as needed. Patient not taking: Reported on 06/28/2014 03/24/13   Trixie DredgeEmily West, PA-C  penicillin v potassium (VEETID) 500 MG tablet Take 1 tablet (500 mg total) by mouth 4 (four) times daily. Patient not taking: Reported on 06/28/2014 03/24/13   Trixie DredgeEmily West, PA-C  promethazine (PHENERGAN) 25 MG tablet Take 1 tablet (25 mg total) by mouth every 6 (six) hours as needed for nausea. 01/27/11 02/03/11  Marisa Severinlga Otter, MD  promethazine (PHENERGAN) 25 MG tablet Take 1 tablet (25 mg total) by mouth every 6 (six) hours as needed for nausea or vomiting. Patient not taking: Reported on 06/28/2014 11/05/13   Emilia BeckKaitlyn Szekalski, PA-C  traMADol (ULTRAM) 50 MG tablet Take 1 tablet (50 mg total) by mouth every 6 (six) hours as needed. Patient not taking: Reported on 06/28/2014 11/05/13   Emilia BeckKaitlyn Szekalski, PA-C   BP 153/97 mmHg  Pulse 89  Temp(Src) 99.3 F (37.4 C) (Oral)  Resp 19  SpO2 98%  LMP 06/21/2014 (Approximate) Physical Exam  Constitutional: She appears well-developed and well-nourished. No distress.  HENT:  Head: Normocephalic and atraumatic.  Right Ear: External ear normal.  Left Ear: External ear normal.  Mouth/Throat: No trismus in the jaw. No uvula swelling. No oropharyngeal exudate, posterior oropharyngeal erythema or tonsillar abscesses.    No facial swelling, several missing teeth, diffuse dental caries, tenderness to palpation at the pick did  Eyes: Conjunctivae  are normal. Right eye exhibits no discharge. Left eye exhibits no discharge. No scleral icterus.  Neck: Neck supple. No tracheal deviation present.  Cardiovascular: Normal rate.   Pulmonary/Chest: Effort normal. No stridor. No respiratory distress.  Musculoskeletal: She exhibits no edema.  Neurological: She is alert. Cranial nerve deficit: no gross deficits.  Skin: Skin is warm and dry. No rash noted.  Psychiatric: She has a normal mood and affect.  Nursing note and vitals reviewed.   ED Course  Procedures (including  critical care time)   MDM   Final diagnoses:  Dental caries    No obvious signs of facial abscess, neck is supple. No lymphadenopathy. Patient has several missing teeth and obvious dental decay. Plan on oral antibiotics, pain medications and close follow-up with a dentist.  I referred her to Dr Daine Gravel, MD 06/28/14 [phone]

## 2014-06-28 NOTE — Discharge Instructions (Signed)
Dental Caries °Dental caries is tooth decay. This decay can cause a hole in teeth (cavity) that can get bigger and deeper over time. °HOME CARE °· Brush and floss your teeth. Do this at least two times a day. °· Use a fluoride toothpaste. °· Use a mouth rinse if told by your dentist or doctor. °· Eat less sugary and starchy foods. Drink less sugary drinks. °· Avoid snacking often on sugary and starchy foods. Avoid sipping often on sugary drinks. °· Keep regular checkups and cleanings with your dentist. °· Use fluoride supplements if told by your dentist or doctor. °· Allow fluoride to be applied to teeth if told by your dentist or doctor. °Document Released: 11/29/2007 Document Revised: 07/06/2013 Document Reviewed: 02/22/2012 °[employer]® Patient Information ©2015 [employer]. This information is not intended to replace advice given to you by your health care provider. Make sure you discuss any questions you have with your health care provider. ° °Dental Pain °A tooth ache may be caused by cavities (tooth decay). Cavities expose the nerve of the tooth to air and hot or cold temperatures. It may come from an infection or abscess (also called a boil or furuncle) around your tooth. It is also often caused by dental caries (tooth decay). This causes the pain you are having. °DIAGNOSIS  °Your caregiver can diagnose this problem by exam. °TREATMENT  °· If caused by an infection, it may be treated with medications which kill germs (antibiotics) and pain medications as prescribed by your caregiver. Take medications as directed. °· Only take over-the-counter or prescription medicines for pain, discomfort, or fever as directed by your caregiver. °· Whether the tooth ache today is caused by infection or dental disease, you should see your dentist as soon as possible for further care. °SEEK MEDICAL CARE IF: °The exam and treatment you received today has been provided on an emergency basis only. This is not a substitute for  complete medical or dental care. If your problem worsens or new problems (symptoms) appear, and you are unable to meet with your dentist, call or return to this location. °SEEK IMMEDIATE MEDICAL CARE IF:  °· You have a fever. °· You develop redness and swelling of your face, jaw, or neck. °· You are unable to open your mouth. °· You have severe pain uncontrolled by pain medicine. °MAKE SURE YOU:  °· Understand these instructions. °· Will watch your condition. °· Will get help right away if you are not doing well or get worse. °Document Released: 02/19/2005 Document Revised: 05/14/2011 Document Reviewed: 10/08/2007 °[employer]® Patient Information ©2015 [employer]. This information is not intended to replace advice given to you by your health care provider. Make sure you discuss any questions you have with your health care provider. ° °

## 2014-06-28 NOTE — ED Notes (Signed)
Pt alert and oriented x4. Respirations even and unlabored, bilateral symmetrical rise and fall of chest. Skin warm and dry. In no acute distress. Denies needs.   

## 2014-08-26 ENCOUNTER — Encounter (HOSPITAL_COMMUNITY): Payer: Self-pay | Admitting: *Deleted

## 2014-08-26 ENCOUNTER — Emergency Department (HOSPITAL_COMMUNITY): Payer: Self-pay

## 2014-08-26 ENCOUNTER — Emergency Department (HOSPITAL_COMMUNITY)
Admission: EM | Admit: 2014-08-26 | Discharge: 2014-08-26 | Disposition: A | Discharge status: Home / Self Care | Payer: Self-pay | Attending: Emergency Medicine | Admitting: Emergency Medicine

## 2014-08-26 DIAGNOSIS — M1990 Unspecified osteoarthritis, unspecified site: Secondary | ICD-10-CM

## 2014-08-26 DIAGNOSIS — M25461 Effusion, right knee: Secondary | ICD-10-CM | POA: Insufficient documentation

## 2014-08-26 DIAGNOSIS — Z8639 Personal history of other endocrine, nutritional and metabolic disease: Secondary | ICD-10-CM | POA: Insufficient documentation

## 2014-08-26 DIAGNOSIS — I10 Essential (primary) hypertension: Secondary | ICD-10-CM | POA: Insufficient documentation

## 2014-08-26 DIAGNOSIS — Z720 Tobacco use: Secondary | ICD-10-CM | POA: Insufficient documentation

## 2014-08-26 DIAGNOSIS — M064 Inflammatory polyarthropathy: Secondary | ICD-10-CM | POA: Insufficient documentation

## 2014-08-26 LAB — CBC
HCT: 38.6 % (ref 36.0–46.0)
Hemoglobin: 12 g/dL (ref 12.0–15.0)
MCH: 27.5 pg (ref 26.0–34.0)
MCHC: 31.1 g/dL (ref 30.0–36.0)
MCV: 88.3 fL (ref 78.0–100.0)
Platelets: 242 10*3/uL (ref 150–400)
RBC: 4.37 MIL/uL (ref 3.87–5.11)
RDW: 15.7 % — AB (ref 11.5–15.5)
WBC: 6.7 10*3/uL (ref 4.0–10.5)

## 2014-08-26 LAB — SYNOVIAL CELL COUNT + DIFF, W/ CRYSTALS
Crystals, Fluid: NONE SEEN
Lymphocytes-Synovial Fld: 24 % — ABNORMAL HIGH (ref 0–20)
Monocyte-Macrophage-Synovial Fluid: 1 % — ABNORMAL LOW (ref 50–90)
NEUTROPHIL, SYNOVIAL: 75 % — AB (ref 0–25)
WBC, SYNOVIAL: 345 /mm3 — AB (ref 0–200)

## 2014-08-26 LAB — BASIC METABOLIC PANEL
ANION GAP: 8 (ref 5–15)
BUN: <5 mg/dL — ABNORMAL LOW (ref 6–20)
CO2: 27 mmol/L (ref 22–32)
Calcium: 9.1 mg/dL (ref 8.9–10.3)
Chloride: 104 mmol/L (ref 101–111)
Creatinine, Ser: 0.92 mg/dL (ref 0.44–1.00)
GFR calc Af Amer: >60 mL/min (ref >60)
GLUCOSE: 94 mg/dL (ref 65–99)
POTASSIUM: 4.4 mmol/L (ref 3.5–5.1)
SODIUM: 139 mmol/L (ref 135–145)

## 2014-08-26 LAB — SEDIMENTATION RATE: Sed Rate: 2 mm/hr (ref 0–22)

## 2014-08-26 LAB — GRAM STAIN
GRAM STAIN: NONE SEEN
SPECIAL REQUESTS: NORMAL

## 2014-08-26 LAB — URIC ACID: URIC ACID, SERUM: 4.9 mg/dL (ref 2.3–6.6)

## 2014-08-26 LAB — C-REACTIVE PROTEIN

## 2014-08-26 MED ORDER — NAPROXEN 500 MG PO TABS: 500.0000 mg | ORAL_TABLET | Freq: Two times a day (BID) | ORAL | Status: DC

## 2014-08-26 MED ORDER — TRIAMCINOLONE ACETONIDE 40 MG/ML IJ SUSP
10.0000 mg | Freq: Once | INTRAMUSCULAR | Status: AC
  Administered 2014-08-26: 10 mg via INTRA_ARTICULAR
  Filled 2014-08-26: qty 1

## 2014-08-26 MED ORDER — LIDOCAINE HCL 2 % IJ SOLN
10.0000 mL | Freq: Once | INTRAMUSCULAR | Status: AC
  Administered 2014-08-26: 200 mg
  Filled 2014-08-26: qty 20

## 2014-08-26 MED ORDER — BUPIVACAINE HCL 0.5 % IJ SOLN
50.0000 mL | Freq: Once | INTRAMUSCULAR | Status: AC
  Administered 2014-08-26: 50 mL
  Filled 2014-08-26: qty 50

## 2014-08-26 MED ORDER — OXYCODONE-ACETAMINOPHEN 5-325 MG PO TABS
2.0000 | ORAL_TABLET | Freq: Once | ORAL | Status: AC
  Administered 2014-08-26: 2 via ORAL
  Filled 2014-08-26: qty 2

## 2014-08-26 MED ORDER — OXYCODONE-ACETAMINOPHEN 5-325 MG PO TABS: 2.0000 | ORAL_TABLET | ORAL | Status: DC | PRN

## 2014-08-26 NOTE — ED Notes (Signed)
Pt c/o right knee pain that is 9/10 and aching constantly.  She denies any known injury or trauma to the knee.  There is visible swelling present, and the upper knee feels soft and spongy to palpation.  Lower knee is also visibly swollen but is more firm to the touch.  Pt is ambulatory with effort and is having an increasingly difficult time with weight bearing activity. She has taken 1 BC powder today at about noon.

## 2014-08-26 NOTE — ED Notes (Signed)
Tightened Ace wrap, provided crutches, and educated on use of crutches for mobility.  Pt demonstrated use of crutches and verbalized understanding of instructions.  Discharged home with family.

## 2014-08-26 NOTE — Discharge Instructions (Signed)
No sign of gout, or infection to the knee on testing tonight. Keep ace wrap on. Crutches, and do not bear weight on knee until pain and swelling are gone. Follow up with Dr. Osie Bond in 7 days if still painful and swollen.  Arthritis, Nonspecific Arthritis is inflammation of a joint. This usually means pain, redness, warmth or swelling are present. One or more joints may be involved. There are a number of types of arthritis. Your caregiver may not be able to tell what type of arthritis you have right away. CAUSES  The most common cause of arthritis is the wear and tear on the joint (osteoarthritis). This causes damage to the cartilage, which can break down over time. The knees, hips, back and neck are most often affected by this type of arthritis. Other types of arthritis and common causes of joint pain include:  Sprains and other injuries near the joint. Sometimes minor sprains and injuries cause pain and swelling that develop hours later.  Rheumatoid arthritis. This affects hands, feet and knees. It usually affects both sides of your body at the same time. It is often associated with chronic ailments, fever, weight loss and general weakness.  Crystal arthritis. Gout and pseudo gout can cause occasional acute severe pain, redness and swelling in the foot, ankle, or knee.  Infectious arthritis. Bacteria can get into a joint through a break in overlying skin. This can cause infection of the joint. Bacteria and viruses can also spread through the blood and affect your joints.  Drug, infectious and allergy reactions. Sometimes joints can become mildly painful and slightly swollen with these types of illnesses. SYMPTOMS   Pain is the main symptom.  Your joint or joints can also be red, swollen and warm or hot to the touch.  You may have a fever with certain types of arthritis, or even feel overall ill.  The joint with arthritis will hurt with movement. Stiffness is present with some types of  arthritis. DIAGNOSIS  Your caregiver will suspect arthritis based on your description of your symptoms and on your exam. Testing may be needed to find the type of arthritis:  Blood and sometimes urine tests.  X-ray tests and sometimes CT or MRI scans.  Removal of fluid from the joint (arthrocentesis) is done to check for bacteria, crystals or other causes. Your caregiver (or a specialist) will numb the area over the joint with a local anesthetic, and use a needle to remove joint fluid for examination. This procedure is only minimally uncomfortable.  Even with these tests, your caregiver may not be able to tell what kind of arthritis you have. Consultation with a specialist (rheumatologist) may be helpful. TREATMENT  Your caregiver will discuss with you treatment specific to your type of arthritis. If the specific type cannot be determined, then the following general recommendations may apply. Treatment of severe joint pain includes:  Rest.  Elevation.  Anti-inflammatory medication (for example, ibuprofen) may be prescribed. Avoiding activities that cause increased pain.  Only take over-the-counter or prescription medicines for pain and discomfort as recommended by your caregiver.  Cold packs over an inflamed joint may be used for 10 to 15 minutes every hour. Hot packs sometimes feel better, but do not use overnight. Do not use hot packs if you are diabetic without your caregiver's permission.  A cortisone shot into arthritic joints may help reduce pain and swelling.  Any acute arthritis that gets worse over the next 1 to 2 days needs to be looked  at to be sure there is no joint infection. Long-term arthritis treatment involves modifying activities and lifestyle to reduce joint stress jarring. This can include weight loss. Also, exercise is needed to nourish the joint cartilage and remove waste. This helps keep the muscles around the joint strong. HOME CARE INSTRUCTIONS   Do not take  aspirin to relieve pain if gout is suspected. This elevates uric acid levels.  Only take over-the-counter or prescription medicines for pain, discomfort or fever as directed by your caregiver.  Rest the joint as much as possible.  If your joint is swollen, keep it elevated.  Use crutches if the painful joint is in your leg.  Drinking plenty of fluids may help for certain types of arthritis.  Follow your caregiver's dietary instructions.  Try low-impact exercise such as:  Swimming.  Water aerobics.  Biking.  Walking.  Morning stiffness is often relieved by a warm shower.  Put your joints through regular range-of-motion. SEEK MEDICAL CARE IF:   You do not feel better in 24 hours or are getting worse.  You have side effects to medications, or are not getting better with treatment. SEEK IMMEDIATE MEDICAL CARE IF:   You have a fever.  You develop severe joint pain, swelling or redness.  Many joints are involved and become painful and swollen.  There is severe back pain and/or leg weakness.  You have loss of bowel or bladder control. Document Released: 03/29/2004 Document Revised: 05/14/2011 Document Reviewed: 04/14/2008 [facility] Patient Information 2015 [place], [place]. This information is not intended to replace advice given to you by your health care provider. Make sure you discuss any questions you have with your health care provider.  Knee Effusion The medical term for having fluid in your knee is effusion. This is often due to an internal derangement of the knee. This means something is wrong inside the knee. Some of the causes of fluid in the knee may be torn cartilage, a torn ligament, or bleeding into the joint from an injury. Your knee is likely more difficult to bend and move. This is often because there is increased pain and pressure in the joint. The time it takes for recovery from a knee effusion depends on different factors, including:   Type of  injury.  Your age.  Physical and medical conditions.  Rehabilitation Strategies. How long you will be away from your normal activities will depend on what kind of knee problem you have and how much damage is present. Your knee has two types of cartilage. Articular cartilage covers the bone ends and lets your knee bend and move smoothly. Two menisci, thick pads of cartilage that form a rim inside the joint, help absorb shock and stabilize your knee. Ligaments bind the bones together and support your knee joint. Muscles move the joint, help support your knee, and take stress off the joint itself. CAUSES  Often an effusion in the knee is caused by an injury to one of the menisci. This is often a tear in the cartilage. Recovery after a meniscus injury depends on how much meniscus is damaged and whether you have damaged other knee tissue. Small tears may heal on their own with conservative treatment. Conservative means rest, limited weight bearing activity and muscle strengthening exercises. Your recovery may take up to 6 weeks.  TREATMENT  Larger tears may require surgery. Meniscus injuries may be treated during arthroscopy. Arthroscopy is a procedure in which your surgeon uses a small telescope like instrument to look in  your knee. Your caregiver can make a more accurate diagnosis (learning what is wrong) by performing an arthroscopic procedure. If your injury is on the inner margin of the meniscus, your surgeon may trim the meniscus back to a smooth rim. In other cases your surgeon will try to repair a damaged meniscus with stitches (sutures). This may make rehabilitation take longer, but may provide better long term result by helping your knee keep its shock absorption capabilities. Ligaments which are completely torn usually require surgery for repair. HOME CARE INSTRUCTIONS  Use crutches as instructed.  If a brace is applied, use as directed.  Once you are home, an ice pack applied to your  swollen knee may help with discomfort and help decrease swelling.  Keep your knee raised (elevated) when you are not up and around or on crutches.  Only take over-the-counter or prescription medicines for pain, discomfort, or fever as directed by your caregiver.  Your caregivers will help with instructions for rehabilitation of your knee. This often includes strengthening exercises.  You may resume a normal diet and activities as directed. SEEK MEDICAL CARE IF:   There is increased swelling in your knee.  You notice redness, swelling, or increasing pain in your knee.  An unexplained oral temperature above 102 F (38.9 C) develops. SEEK IMMEDIATE MEDICAL CARE IF:   You develop a rash.  You have difficulty breathing.  You have any allergic reactions from medications you may have been given.  There is severe pain with any motion of the knee. MAKE SURE YOU:   Understand these instructions.  Will watch your condition.  Will get help right away if you are not doing well or get worse. Document Released: 05/12/2003 Document Revised: 05/14/2011 Document Reviewed: 07/16/2007 [facility] Patient Information 2015 [place], [place]. This information is not intended to replace advice given to you by your health care provider. Make sure you discuss any questions you have with your health care provider.

## 2014-08-26 NOTE — ED Provider Notes (Signed)
CSN: [id number]     Arrival date & time 08/26/14  1355 History   First MD Initiated Contact with Patient 08/26/14 1627     Chief Complaint  Patient presents with  . Knee Pain     HPI  Patient presents presents for evaluation of right knee pain. Atraumatic. There is swelling for the last 5 days. She had an episode last week where it was inflamed and swollen and painful after to 3 days a resolve without any specific treatment. 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 No history personally or family history of gout. No fevers no chills. Is not red. No other areas of joint pain.  Past Medical History  Diagnosis Date  . Addison disease   . Hypertension    Past Surgical History  Procedure Laterality Date  . Back surgery     History reviewed. No pertinent family history. History  Substance Use Topics  . Smoking status: Current Every Day Smoker -- 1.00 packs/day    Types: Cigarettes  . Smokeless tobacco: Never Used  . Alcohol Use: No   OB History    No data available     Review of Systems  Constitutional: Negative for fever, chills, diaphoresis, appetite change and fatigue.  HENT: Negative for mouth sores, sore throat and trouble swallowing.   Eyes: Negative for visual disturbance.  Respiratory: Negative for cough, chest tightness, shortness of breath and wheezing.   Cardiovascular: Negative for chest pain.  Gastrointestinal: Negative for nausea, vomiting, abdominal pain, diarrhea and abdominal distention.  Endocrine: Negative for polydipsia, polyphagia and polyuria.  Genitourinary: Negative for dysuria, frequency and hematuria.  Musculoskeletal: Positive for joint swelling and arthralgias. Negative for gait problem.       Right knee pain and swelling  Skin: Negative for color change, pallor and rash.  Neurological: Negative for dizziness, syncope, light-headedness and headaches.  Hematological: Does not bruise/bleed easily.  Psychiatric/Behavioral: Negative for  behavioral problems and confusion.      Allergies  Codeine  Home Medications   Prior to Admission medications   Medication Sig Start Date End Date Taking? Authorizing Provider  Aspirin-Acetaminophen-Caffeine (GOODY HEADACHE PO) Take 1 each by mouth every 2 (two) hours as needed (knee pain).    Yes Historical Provider, MD  naproxen (NAPROSYN) 500 MG tablet Take 1 tablet (500 mg total) by mouth 2 (two) times daily. Patient not taking: Reported on 08/26/2014 06/28/14   Linwood Dibbles, MD  oxyCODONE-acetaminophen (PERCOCET/ROXICET) 5-325 MG per tablet Take 1-2 tablets by mouth every 6 (six) hours as needed. Patient not taking: Reported on 08/26/2014 06/28/14   Linwood Dibbles, MD   BP 149/87 mmHg  Pulse 63  Temp(Src) 97.8 F (36.6 C) (Oral)  Resp 18  SpO2 96%  LMP 08/08/2014 Physical Exam  Constitutional: She is oriented to person, place, and time. She appears well-developed and well-nourished. No distress.  HENT:  Head: Normocephalic.  Eyes: Conjunctivae are normal. Pupils are equal, round, and reactive to light. No scleral icterus.  Neck: Normal range of motion. Neck supple. No thyromegaly present.  Cardiovascular: Normal rate and regular rhythm.  Exam reveals no gallop and no friction rub.   No murmur heard. Pulmonary/Chest: Effort normal and breath sounds normal. No respiratory distress. She has no wheezes. She has no rales.  Abdominal: Soft. Bowel sounds are normal. She exhibits no distension. There is no tenderness. There is no rebound.  Musculoskeletal: Normal range of motion.       Legs: Neurological: She is alert  and oriented to person, place, and time.  Skin: Skin is warm and dry. No rash noted.  Psychiatric: She has a normal mood and affect. Her behavior is normal.    ED Course  Procedures (including critical care time) Labs Review Labs Reviewed  CBC - Abnormal; Notable for the following:    RDW 15.7 (*)    All other components within normal limits  BASIC METABOLIC PANEL -  Abnormal; Notable for the following:    BUN <5 (*)    All other components within normal limits  SYNOVIAL CELL COUNT + DIFF, W/ CRYSTALS - Abnormal; Notable for the following:    Appearance-Synovial CLOUDY (*)    WBC, Synovial 345 (*)    Neutrophil, Synovial 75 (*)    Lymphocytes-Synovial Fld 24 (*)    Monocyte-Macrophage-Synovial Fluid 1 (*)    All other components within normal limits  GRAM STAIN  BODY FLUID CULTURE  C-REACTIVE PROTEIN  SEDIMENTATION RATE  URIC ACID    Imaging Review Dg Knee Complete 4 Views Right  08/26/2014   CLINICAL DATA:  Diffuse right knee pain for 3 days. No known injury. Initial encounter.  EXAM: RIGHT KNEE - COMPLETE 4+ VIEW  COMPARISON:  None.  FINDINGS: The patient has a small to moderate joint effusion. No fracture is identified. Joint spaces are preserved. There is no focal bony lesion.  IMPRESSION: Small to moderate joint effusion.  Otherwise negative.   Electronically Signed   By: Drusilla Kanner M.D.   On: 08/26/2014 17:58     EKG Interpretation None      MDM   Final diagnoses:  Knee effusion, right  Inflammatory arthritis    ARTHOCENTESIS Performed by: Claudean Kinds Consent: Verbal consent obtained. Risks and benefits: risks, benefits and alternatives were discussed Consent given by: patient Required items: required blood products, implants, devices, and special equipment available Patient identity confirmed: verbally with patient Time out: Immediately prior to procedure a "time out" was called to verify the correct patient, procedure, equipment, support staff and site/side marked as required. Indications: Knee effusion and pain Joint: right Patello-femoral Local anesthesia used: Yes. 2% Lido, plain. Preparation: Patient was prepped and draped in the usual sterile fashion. Aspirate appearance: Serous, thin. Aspirate amount: 120 ml ml Patient tolerance: Patient tolerated the procedure well with no immediate  complications.  Uric acid elevation. No leukocytosis. Her Gram stain for bacteria. Only 3 and 45 white blood cells. Exam, aspirin, and findings all suggest simple inflammatory arthritis. After arthrocentesis she was given Marcaine, and Kenalog intra-articular. Plan is home, Ace wrap applied. Crutches. Minimize weightbearing. Frequent/intermittent ice. Naproxen. Pain medication. With the follow-up with not resolving.     Rolland Porter, MD 08/26/14 2157

## 2014-09-01 LAB — CULTURE, BODY FLUID W GRAM STAIN -BOTTLE: Culture: NO GROWTH

## 2014-09-01 LAB — CULTURE, BODY FLUID-BOTTLE

## 2014-09-03 NOTE — Progress Notes (Addendum)
[facility] 09[facility]/14 stating pt wanting to get her oxycodone and not her naprosyn prescription filled (both Prescriptions (Rx) on the same sheet)  CM confirmed with Kayla per standard pharmacy guidelines that if the prescriptions are on the same sheet pt is required to get both and not just one Rx filled Cm encouraged Kayla to follow her pharmacy guidelines and to educate the pt

## 2014-12-02 ENCOUNTER — Encounter (HOSPITAL_COMMUNITY): Payer: Self-pay | Admitting: Emergency Medicine

## 2014-12-02 ENCOUNTER — Emergency Department (HOSPITAL_COMMUNITY)
Admission: EM | Admit: 2014-12-02 | Discharge: 2014-12-03 | Disposition: A | Discharge status: Home / Self Care | Payer: Self-pay | Attending: Emergency Medicine | Admitting: Emergency Medicine

## 2014-12-02 DIAGNOSIS — I10 Essential (primary) hypertension: Secondary | ICD-10-CM | POA: Insufficient documentation

## 2014-12-02 DIAGNOSIS — Z720 Tobacco use: Secondary | ICD-10-CM | POA: Insufficient documentation

## 2014-12-02 DIAGNOSIS — Z8639 Personal history of other endocrine, nutritional and metabolic disease: Secondary | ICD-10-CM | POA: Insufficient documentation

## 2014-12-02 DIAGNOSIS — M79604 Pain in right leg: Secondary | ICD-10-CM | POA: Insufficient documentation

## 2014-12-02 DIAGNOSIS — M7989 Other specified soft tissue disorders: Secondary | ICD-10-CM | POA: Insufficient documentation

## 2014-12-02 LAB — I-STAT CHEM 8, ED
CREATININE: 0.9 mg/dL (ref 0.44–1.00)
Calcium, Ion: 1.07 mmol/L — ABNORMAL LOW (ref 1.12–1.23)
Chloride: 99 mmol/L — ABNORMAL LOW (ref 101–111)
Glucose, Bld: 84 mg/dL (ref 65–99)
HEMATOCRIT: 41 % (ref 36.0–46.0)
Hemoglobin: 13.9 g/dL (ref 12.0–15.0)
POTASSIUM: 3.2 mmol/L — AB (ref 3.5–5.1)
SODIUM: 138 mmol/L (ref 135–145)
TCO2: 23 mmol/L (ref 0–100)

## 2014-12-02 LAB — D-DIMER, QUANTITATIVE: D-Dimer, Quant: 0.69 ug/mL-FEU — ABNORMAL HIGH (ref 0.00–0.48)

## 2014-12-02 MED ORDER — ENOXAPARIN SODIUM 60 MG/0.6ML ~~LOC~~ SOLN
60.0000 mg | Freq: Once | SUBCUTANEOUS | Status: AC
  Administered 2014-12-02: 60 mg via SUBCUTANEOUS
  Filled 2014-12-02: qty 0.6

## 2014-12-02 MED ORDER — OXYCODONE-ACETAMINOPHEN 5-325 MG PO TABS: 1.0000 | ORAL_TABLET | ORAL | Status: DC | PRN

## 2014-12-02 MED ORDER — KETOROLAC TROMETHAMINE 15 MG/ML IJ SOLN
15.0000 mg | Freq: Once | INTRAMUSCULAR | Status: AC
  Administered 2014-12-02: 15 mg via INTRAMUSCULAR
  Filled 2014-12-02: qty 1

## 2014-12-02 MED ORDER — MORPHINE SULFATE (PF) 4 MG/ML IV SOLN
6.0000 mg | Freq: Once | INTRAVENOUS | Status: AC
  Administered 2014-12-02: 6 mg via INTRAMUSCULAR
  Filled 2014-12-02: qty 2

## 2014-12-02 MED ORDER — POTASSIUM CHLORIDE CRYS ER 20 MEQ PO TBCR
40.0000 meq | EXTENDED_RELEASE_TABLET | Freq: Once | ORAL | Status: AC
  Administered 2014-12-02: 40 meq via ORAL
  Filled 2014-12-02: qty 2

## 2014-12-02 MED ORDER — HYDROMORPHONE HCL 1 MG/ML IJ SOLN
1.0000 mg | Freq: Once | INTRAMUSCULAR | Status: AC
  Administered 2014-12-02: 1 mg via INTRAMUSCULAR
  Filled 2014-12-02: qty 1

## 2014-12-02 MED ORDER — LORAZEPAM 1 MG PO TABS
1.0000 mg | ORAL_TABLET | Freq: Once | ORAL | Status: AC
  Administered 2014-12-02: 1 mg via ORAL
  Filled 2014-12-02: qty 1

## 2014-12-02 NOTE — ED Notes (Addendum)
Pt states 2x days ago began having right leg pain and swelling primarily in the knee and posterior calf, also in the right hip. States she is also having a lot of venous distention and "knots" pop out on her right lower leg that aren't usually there. No redness/excessive warmth to area. States it feels similar to a cramp, denies any SOB

## 2014-12-03 ENCOUNTER — Ambulatory Visit (HOSPITAL_COMMUNITY)
Admission: RE | Admit: 2014-12-03 | Discharge: 2014-12-03 | Disposition: A | Discharge status: Home / Self Care | Payer: Self-pay | Source: Ambulatory Visit | Attending: Emergency Medicine | Admitting: Emergency Medicine

## 2014-12-03 ENCOUNTER — Ambulatory Visit (HOSPITAL_BASED_OUTPATIENT_CLINIC_OR_DEPARTMENT_OTHER)
Admission: RE | Admit: 2014-12-03 | Discharge: 2014-12-03 | Disposition: A | Discharge status: Home / Self Care | Payer: Self-pay | Source: Ambulatory Visit | Attending: Emergency Medicine | Admitting: Emergency Medicine

## 2014-12-03 ENCOUNTER — Encounter (HOSPITAL_COMMUNITY): Payer: Self-pay | Admitting: *Deleted

## 2014-12-03 ENCOUNTER — Emergency Department (HOSPITAL_COMMUNITY)
Admission: EM | Admit: 2014-12-03 | Discharge: 2014-12-03 | Disposition: A | Discharge status: Home / Self Care | Payer: Self-pay | Attending: Physician Assistant | Admitting: Physician Assistant

## 2014-12-03 ENCOUNTER — Telehealth (HOSPITAL_COMMUNITY): Payer: Self-pay | Admitting: Emergency Medicine

## 2014-12-03 ENCOUNTER — Encounter (HOSPITAL_COMMUNITY): Payer: Self-pay

## 2014-12-03 ENCOUNTER — Other Ambulatory Visit (HOSPITAL_COMMUNITY): Payer: Self-pay | Admitting: Emergency Medicine

## 2014-12-03 DIAGNOSIS — Z720 Tobacco use: Secondary | ICD-10-CM | POA: Insufficient documentation

## 2014-12-03 DIAGNOSIS — Z8639 Personal history of other endocrine, nutritional and metabolic disease: Secondary | ICD-10-CM | POA: Insufficient documentation

## 2014-12-03 DIAGNOSIS — I82401 Acute embolism and thrombosis of unspecified deep veins of right lower extremity: Secondary | ICD-10-CM

## 2014-12-03 DIAGNOSIS — I743 Embolism and thrombosis of arteries of the lower extremities: Secondary | ICD-10-CM | POA: Insufficient documentation

## 2014-12-03 DIAGNOSIS — M79661 Pain in right lower leg: Secondary | ICD-10-CM

## 2014-12-03 DIAGNOSIS — M79609 Pain in unspecified limb: Secondary | ICD-10-CM

## 2014-12-03 DIAGNOSIS — I824Z1 Acute embolism and thrombosis of unspecified deep veins of right distal lower extremity: Secondary | ICD-10-CM | POA: Insufficient documentation

## 2014-12-03 DIAGNOSIS — I10 Essential (primary) hypertension: Secondary | ICD-10-CM | POA: Insufficient documentation

## 2014-12-03 LAB — APTT: aPTT: 26 s (ref 24–37)

## 2014-12-03 LAB — BASIC METABOLIC PANEL WITH GFR
Anion gap: 6 (ref 5–15)
BUN: <5 mg/dL — ABNORMAL LOW (ref 6–20)
CO2: 27 mmol/L (ref 22–32)
Calcium: 8.7 mg/dL — ABNORMAL LOW (ref 8.9–10.3)
Chloride: 104 mmol/L (ref 101–111)
Creatinine, Ser: 0.76 mg/dL (ref 0.44–1.00)
GFR calc Af Amer: >60 mL/min
GFR calc non Af Amer: >60 mL/min
Glucose, Bld: 85 mg/dL (ref 65–99)
Potassium: 3.8 mmol/L (ref 3.5–5.1)
Sodium: 137 mmol/L (ref 135–145)

## 2014-12-03 LAB — CBC
HCT: 39.6 % (ref 36.0–46.0)
Hemoglobin: 12.3 g/dL (ref 12.0–15.0)
MCH: 27.3 pg (ref 26.0–34.0)
MCHC: 31.1 g/dL (ref 30.0–36.0)
MCV: 88 fL (ref 78.0–100.0)
Platelets: 210 K/uL (ref 150–400)
RBC: 4.5 MIL/uL (ref 3.87–5.11)
RDW: 16.9 % — ABNORMAL HIGH (ref 11.5–15.5)
WBC: 8.6 K/uL (ref 4.0–10.5)

## 2014-12-03 MED ORDER — ENOXAPARIN SODIUM 80 MG/0.8ML ~~LOC~~ SOLN
80.0000 mg | Freq: Two times a day (BID) | SUBCUTANEOUS | Status: DC
  Administered 2014-12-03: 80 mg via SUBCUTANEOUS
  Filled 2014-12-03 (×2): qty 0.8

## 2014-12-03 MED ORDER — FENTANYL CITRATE (PF) 100 MCG/2ML IJ SOLN
50.0000 ug | Freq: Once | INTRAMUSCULAR | Status: AC
  Administered 2014-12-03: 50 ug via INTRAVENOUS
  Filled 2014-12-03: qty 2

## 2014-12-03 MED ORDER — OXYCODONE-ACETAMINOPHEN 5-325 MG PO TABS
1.0000 | ORAL_TABLET | Freq: Once | ORAL | Status: AC
  Administered 2014-12-03: 1 via ORAL
  Filled 2014-12-03: qty 1

## 2014-12-03 MED ORDER — ONDANSETRON 4 MG PO TBDP
8.0000 mg | ORAL_TABLET | Freq: Once | ORAL | Status: AC
  Administered 2014-12-03: 8 mg via ORAL
  Filled 2014-12-03: qty 2

## 2014-12-03 MED ORDER — XARELTO VTE STARTER PACK 15 & 20 MG PO TBPK: 15.0000 mg | ORAL_TABLET | ORAL | Status: DC

## 2014-12-03 MED ORDER — RIVAROXABAN 15 MG PO TABS
15.0000 mg | ORAL_TABLET | Freq: Two times a day (BID) | ORAL | Status: DC
  Filled 2014-12-03: qty 1

## 2014-12-03 MED ORDER — OXYCODONE-ACETAMINOPHEN 5-325 MG PO TABS: 2.0000 | ORAL_TABLET | ORAL | Status: DC | PRN

## 2014-12-03 NOTE — Progress Notes (Signed)
ANTICOAGULATION CONSULT NOTE - Initial Consult  Pharmacy Consult for lovenox Indication: acute DVT  Allergies  Allergen Reactions  . Codeine     Hives/swelling    Patient Measurements:    Vital Signs: Temp: 98.6 F (37 C) (09/30 1512) Temp Source: Oral (09/30 1512) BP: 150/110 mmHg (09/30 1730) Pulse Rate: 93 (09/30 1730)  Labs:  Recent Labs  12/02/14 2006  HGB 13.9  HCT 41.0  CREATININE 0.90    CrCl cannot be calculated (Unknown ideal weight.).   Medical History: Past Medical History  Diagnosis Date  . Addison disease   . Hypertension      Assessment: 43 [facility] after found to have acute DVT in R calf. No previous history or anticoagulation pta. Pharmacy consulted to dose enoxparin for DVT. Hg wnl, no platelet lab available yet. No bleed documented. CrCl>30, weight 77.2 kg.  Goal of Therapy:  Anti-Xa level 0.6-1 units/ml 4hrs after LMWH dose given Monitor platelets by anticoagulation protocol: Yes   Plan:  Enoxaparin 80 mg (~1mg /kg) [place] q12h Mon CBC q72h, s/sx bleeding  Babs Bertin, PharmD Clinical Pharmacist Pager [phone] 12/03/2014 5:47 PM

## 2014-12-03 NOTE — Discharge Instructions (Signed)
Deep Vein Thrombosis °A deep vein thrombosis (DVT) is a blood clot that develops in the deep, larger veins of the leg, arm, or pelvis. These are more dangerous than clots that might form in veins near the surface of the body. A DVT can lead to serious and even life-threatening complications if the clot breaks off and travels in the bloodstream to the lungs.  °A DVT can damage the valves in your leg veins so that instead of flowing upward, the blood pools in the lower leg. This is called post-thrombotic syndrome, and it can result in pain, swelling, discoloration, and sores on the leg. °CAUSES °Usually, several things contribute to the formation of blood clots. Contributing factors include: °· The flow of blood slows down. °· The inside of the vein is damaged in some way. °· You have a condition that makes blood clot more easily. °RISK FACTORS °Some people are more likely than others to develop blood clots. Risk factors include:  °· Smoking. °· Being overweight (obese). °· Sitting or lying still for a long time. This includes long-distance travel, paralysis, or recovery from an illness or surgery. °Other factors that increase risk are:  °· Older age, especially over 75 years of age. °· Having a family history of blood clots or if you have already had a blot clot. °· Having major or lengthy surgery. This is especially true for surgery on the hip, knee, or belly (abdomen). Hip surgery is particularly high risk. °· Having a long, thin tube (catheter) placed inside a vein during a medical procedure. °· Breaking a hip or leg. °· Having cancer or cancer treatment. °· Pregnancy and childbirth. °¨ Hormone changes make the blood clot more easily during pregnancy. °¨ The fetus puts pressure on the veins of the pelvis. °¨ There is a risk of injury to veins during delivery or a caesarean delivery. The risk is highest just after childbirth. °· Medicines containing the female hormone estrogen. This includes birth control pills and  hormone replacement therapy. °· Other circulation or heart problems. ° °SIGNS AND SYMPTOMS °When a clot forms, it can either partially or totally block the blood flow in that vein. Symptoms of a DVT can include: °· Swelling of the leg or arm, especially if one side is much worse. °· Warmth and redness of the leg or arm, especially if one side is much worse. °· Pain in an arm or leg. If the clot is in the leg, symptoms may be more noticeable or worse when standing or walking. °The symptoms of a DVT that has traveled to the lungs (pulmonary embolism, PE) usually start suddenly and include: °· Shortness of breath. °· Coughing. °· Coughing up blood or blood-tinged mucus. °· Chest pain. The chest pain is often worse with deep breaths. °· Rapid heartbeat. °Anyone with these symptoms should get emergency medical treatment right away. Do not wait to see if the symptoms will go away. Call your local emergency services (911 in the [place] if you have these symptoms. Do not drive yourself to the hospital. °DIAGNOSIS °If a DVT is suspected, your health care provider will take a full medical history and perform a physical exam. Tests that also may be required include: °· Blood tests, including studies of the clotting properties of the blood. °· Ultrasound to see if you have clots in your legs or lungs. °· X-rays to show the flow of blood when dye is injected into the veins (venogram). °· Studies of your lungs if you have any   chest symptoms. °PREVENTION °· Exercise the legs regularly. Take a brisk 30-minute walk every day. °· Maintain a weight that is appropriate for your height. °· Avoid sitting or lying in bed for long periods of time without moving your legs. °· Women, particularly those over the age of 35 years, should consider the risks and benefits of taking estrogen medicines, including birth control pills. °· Do not smoke, especially if you take estrogen medicines. °· Long-distance travel can increase your risk of DVT. You  should exercise your legs by walking or pumping the muscles every hour. °· Many of the risk factors above relate to situations that exist with hospitalization, either for illness, injury, or elective surgery. Prevention may include medical and nonmedical measures. °¨ Your health care provider will assess you for the need for venous thromboembolism prevention when you are admitted to the hospital. If you are having surgery, your surgeon will assess you the day of or day after surgery. °TREATMENT °Once identified, a DVT can be treated. It can also be prevented in some circumstances. Once you have had a DVT, you may be at increased risk for a DVT in the future. The most common treatment for DVT is blood-thinning (anticoagulant) medicine, which reduces the blood's tendency to clot. Anticoagulants can stop new blood clots from forming and stop old clots from growing. They cannot dissolve existing clots. Your body does this by itself over time. Anticoagulants can be given by mouth, through an IV tube, or by injection. Your health care provider will determine the best program for you. Other medicines or treatments that may be used are: °· Heparin or related medicines (low molecular weight heparin) are often the first treatment for a blood clot. They act quickly. However, they cannot be taken orally and must be given either in shot form or by IV tube. °¨ Heparin can cause a fall in a component of blood that stops bleeding and forms blood clots (platelets). You will be monitored with blood tests to be sure this does not occur. °· Warfarin is an anticoagulant that can be swallowed. It takes a few days to start working, so usually heparin or related medicines are used in combination. Once warfarin is working, heparin is usually stopped. °· Factor Xa inhibitor medicines, such as rivaroxaban and apixaban, also reduce blood clotting. These medicines are taken orally and can often be used without heparin or related  medicines. °· Less commonly, clot dissolving drugs (thrombolytics) are used to dissolve a DVT. They carry a high risk of bleeding, so they are used mainly in severe cases where your life or a part of your body is threatened. °· Very rarely, a blood clot in the leg needs to be removed surgically. °· If you are unable to take anticoagulants, your health care provider may arrange for you to have a filter placed in a main vein in your abdomen. This filter prevents clots from traveling to your lungs. °HOME CARE INSTRUCTIONS °· Take all medicines as directed by your health care provider. °· Learn as much as you can about DVT. °· Wear a medical alert bracelet or carry a medical alert card. °· Ask your health care provider how soon you can go back to normal activities. It is important to stay active to prevent blood clots. If you are on anticoagulant medicine, avoid contact sports. °· It is very important to exercise. This is especially important while traveling, sitting, or standing for long periods of time. Exercise your legs by walking or by   tightening and relaxing your leg muscles regularly. Take frequent walks. °· You may need to wear compression stockings. These are tight elastic stockings that apply pressure to the lower legs. This pressure can help keep the blood in the legs from clotting. °Taking Warfarin °Warfarin is a daily medicine that is taken by mouth. Your health care provider will advise you on the length of treatment (usually 3-6 months, sometimes lifelong). If you take warfarin: °· Understand how to take warfarin and foods that can affect how warfarin works in your body. °· Too much and too little warfarin are both dangerous. Too much warfarin increases the risk of bleeding. Too little warfarin continues to allow the risk for blood clots. °Warfarin and Regular Blood Testing °While taking warfarin, you will need to have regular blood tests to measure your blood clotting time. These blood tests usually  include both the prothrombin time (PT) and international normalized ratio (INR) tests. The PT and INR results allow your health care provider to adjust your dose of warfarin. It is very important that you have your PT and INR tested as often as directed by your health care provider.    °Warfarin and Your Diet °Avoid major changes in your diet, or notify your health care provider before changing your diet. Arrange a visit with a registered dietitian to answer your questions. Many foods, especially foods high in vitamin K, can interfere with warfarin and affect the PT and INR results. You should eat a consistent amount of foods high in vitamin K. Foods high in vitamin K include:  °· Spinach, kale, broccoli, cabbage, collard and turnip greens, Brussels sprouts, peas, cauliflower, seaweed, and parsley. °· Beef and pork liver. °· Green tea. °· Soybean oil. °Warfarin with Other Medicines °Many medicines can interfere with warfarin and affect the PT and INR results. You must: °· Tell your health care provider about any and all medicines, vitamins, and supplements you take, including aspirin and other over-the-counter anti-inflammatory medicines. Be especially cautious with aspirin and anti-inflammatory medicines. Ask your health care provider before taking these. °· Do not take or discontinue any prescribed or over-the-counter medicine except on the advice of your health care provider or pharmacist. °Warfarin Side Effects °Warfarin can have side effects, such as easy bruising and difficulty stopping bleeding. Ask your health care provider or pharmacist about other side effects of warfarin. You will need to: °· Hold pressure over cuts for longer than usual. °· Notify your dentist and other health care providers that you are taking warfarin before you undergo any procedures where bleeding may occur. °Warfarin with Alcohol and Tobacco  °· Drinking alcohol frequently can increase the effect of warfarin, leading to excess  bleeding. It is best to avoid alcoholic drinks or to consume only very small amounts while taking warfarin. Notify your health care provider if you change your alcohol intake.   °· Do not use any tobacco products including cigarettes, chewing tobacco, or electronic cigarettes. If you smoke, quit. Ask your health care provider for help with quitting smoking. °Alternative Medicines to Warfarin: Factor Xa Inhibitor Medicines °· These blood-thinning medicines are taken by mouth, usually for several weeks or longer. It is important to take the medicine every single day at the same time each day. °· There are no regular blood tests required when using these medicines. °· There are fewer food and drug interactions than with warfarin. °· The side effects of this class of medicine are similar to those of warfarin, including excessive bruising or bleeding. Ask your   health care provider or pharmacist about other potential side effects. SEEK MEDICAL CARE IF:  You notice a rapid heartbeat.  You feel weaker or more tired than usual.  You feel faint.  You notice increased bruising.  You feel your symptoms are not getting better in the time expected.  You believe you are having side effects of medicine. SEEK IMMEDIATE MEDICAL CARE IF:  You have chest pain.  You have trouble breathing.  You have new or increased swelling or pain in one leg.  You cough up blood.  You notice blood in vomit, in a bowel movement, or in urine. MAKE SURE YOU:  Understand these instructions.  Will watch your condition.  Will get help right away if you are not doing well or get worse. Document Released: 02/19/2005 Document Revised: 07/06/2013 Document Reviewed: 10/27/2012 [facility] Patient Information 2015 [place], [place]. This information is not intended to replace advice given to you by your health care provider. Make sure you discuss any questions you have with your health care provider.  MUST follow-up with primary care  provider as soon as possible for evaluation of DVT patient on xarelto. Take xarelto as prescribed. Avoid smoking. Return to the emergency department a few experience or pale right lower extremity, worsening of pain, numbness or tingling and right lower extremity, chest pain, shortness of breath, syncope.

## 2014-12-03 NOTE — ED Notes (Signed)
The pt is c/o rt leg pain for 4 days no previous history.    She was sent here from [facility] after being diagnosed with a dvt in her rt calf  Vein and artery. lmp  2 months ago  Irregular with age

## 2014-12-03 NOTE — Progress Notes (Signed)
VASCULAR LAB PRELIMINARY  PRELIMINARY  PRELIMINARY  PRELIMINARY  VASCULAR LAB PRELIMINARY  ARTERIAL  ABI completed: Bilateral ABIs appeared within normal limits at rest.     RIGHT    LEFT    PRESSURE WAVEFORM  PRESSURE WAVEFORM  BRACHIAL 146 Normal BRACHIAL 149 Normal  DP 149 Triphasic DP 157 Triphasic  PT 144 Retrograde Biphasic PT 150 Triphasic    RIGHT LEFT  ABI 1.0 1.0     Sturdivant, Rita D, RVT 12/03/2014, 3:10 PM    Sturdivant, Gerarda Gunther, RVT 12/03/2014, 3:10 PM

## 2014-12-03 NOTE — ED Provider Notes (Signed)
CSN: [id number]     Arrival date & time 12/03/14  1506 History   First MD Initiated Contact with Patient 12/03/14 1711     Chief Complaint  Patient presents with  . Leg Pain     (Consider location/radiation/quality/duration/timing/severity/associated sxs/prior Treatment) HPI Comments: Kristin Hickman is a 44 y.o F with pmhx of Addison's who presents to the ED today after being evaluated at [facility] last night for RLE pain and swelling x4days. Pt has LE venous dopplers today [facility] and discovered to have acute DVT in the proximal posterior tibial and peroneal veins as well as an acute thrombus in the right proximal tibial peroneal artery trunk. Denies chest pain, shortness of breath, paresthesias, numbness, weakness, fevers. Patient is a heavy smoker, 2 packs per day for 20 years. Patient is not on exogenous estrogen. No recent surgery or trauma. Patient is not sedentary. No recent travel. No history of cancer or atrial fibrillation.   Patient is a 44 y.o. female presenting with leg pain. The history is provided by the patient.  Leg Pain   Past Medical History  Diagnosis Date  . Addison disease   . Hypertension    Past Surgical History  Procedure Laterality Date  . Back surgery     No family history on file. Social History  Substance Use Topics  . Smoking status: Current Every Day Smoker -- 1.00 packs/day    Types: Cigarettes  . Smokeless tobacco: Never Used  . Alcohol Use: No   OB History    No data available     Review of Systems  All other systems reviewed and are negative.     Allergies  Codeine  Home Medications   Prior to Admission medications   Medication Sig Start Date End Date Taking? Authorizing Provider  aspirin-acetaminophen-caffeine (EXCEDRIN MIGRAINE) [phone] MG tablet Take 1 tablet by mouth every 6 (six) hours as needed for headache.    Historical Provider, MD  Aspirin-Acetaminophen-Caffeine (GOODY HEADACHE PO) Take 1 each by mouth every 2 (two)  hours as needed (knee pain).     Historical Provider, MD  naproxen (NAPROSYN) 500 MG tablet Take 1 tablet (500 mg total) by mouth 2 (two) times daily. Patient not taking: Reported on 12/02/2014 08/26/14   Rolland Porter, MD  oxyCODONE-acetaminophen (PERCOCET/ROXICET) 5-325 MG per tablet Take 2 tablets by mouth every 4 (four) hours as needed. Patient not taking: Reported on 12/02/2014 08/26/14   Rolland Porter, MD  oxyCODONE-acetaminophen (PERCOCET/ROXICET) 5-325 MG tablet Take 1-2 tablets by mouth every 4 (four) hours as needed for severe pain. 12/02/14   Raeford Razor, MD   BP 127/77 mmHg  Pulse 80  Temp(Src) 98.6 F (37 C) (Oral)  Resp 21  Wt 170 lb 4.8 oz (77.248 kg)  SpO2 97%  LMP 10/02/2014 Physical Exam  Constitutional: She is oriented to person, place, and time. She appears well-developed and well-nourished. No distress.  HENT:  Head: Normocephalic and atraumatic.  Mouth/Throat: Oropharynx is clear and moist. No oropharyngeal exudate.  Eyes: Conjunctivae and EOM are normal. Pupils are equal, round, and reactive to light. Right eye exhibits no discharge. Left eye exhibits no discharge. No scleral icterus.  Neck: Normal range of motion. Neck supple.  Cardiovascular: Normal rate, regular rhythm, normal heart sounds and intact distal pulses.  Exam reveals no gallop and no friction rub.   No murmur heard. Pulmonary/Chest: Effort normal and breath sounds normal. No respiratory distress. She has no wheezes. She has no rales. She exhibits no tenderness.  Abdominal: Soft. Bowel sounds are normal. She exhibits no distension and no mass. There is no tenderness. There is no rebound and no guarding.  Musculoskeletal: Normal range of motion. She exhibits tenderness. She exhibits no edema.  Good DP and PT pulse. NO pallor. NO paresthesias.  NO decrease ROM in R knee or ankle.  Positive Homan's sign.  Obvious varicosities over right calf. TTP.   Neurological: She is alert and oriented to person, place, and  time. No cranial nerve deficit. She exhibits normal muscle tone.  Skin: Skin is warm and dry. No rash noted. She is not diaphoretic. No erythema. No pallor.  Psychiatric: She has a normal mood and affect. Her behavior is normal.  Nursing note and vitals reviewed.   ED Course  Procedures (including critical care time) Labs Review Labs Reviewed  BASIC METABOLIC PANEL - Abnormal; Notable for the following:    BUN <5 (*)    Calcium 8.7 (*)    All other components within normal limits  CBC - Abnormal; Notable for the following:    RDW 16.9 (*)    All other components within normal limits  APTT  CBC    Imaging Review No results found. I have personally reviewed and evaluated these images and lab results as part of my medical decision-making.   EKG Interpretation None      MDM   Final diagnoses:  DVT (deep venous thrombosis), right    Patient presents from ultrasound lab for acute DVT and possible acute arterial thrombus. Per ultrasound report patient has acute DVT and proximal posterior tibial and peroneal veins as well as acute thrombus and right proximal tibial peroneal artery trunk.  Spoke with vascular surgery on call who states that since patient has normal ABIs , no pulselessness, no paresthesias, no chest pain, no shortness of breath patient does not need CT angiogram with PE study at this time. Does not require admission. Placed patient on xarelto and follow-up in clinic outpatient.  Patient given therapeutic dose of Lovenox and ED. Spoke with case manager who recommended switched to Xarelto due to patient's financial status without insurance patient will be able to get Xarelto for free.  Vital signs stable. Discussed treatment plan with patient who is agreeable. Patient given strict return precautions. Recommend follow-up with vascular surgery as well as primary care at [facility].  Patient seen by case manager who educated patient on how to take  Xarelto.  Dub Mikes, PA-C 12/03/14 1937  Dub Mikes, PA-C 12/03/14 1944  Courteney Randall An, MD 12/03/14 2342

## 2014-12-03 NOTE — Care Management (Signed)
ED CM consulted by S. Tripp PA-C concerning medications assistance for Xarelto.  CM reviewed patient's record. Patient is uninsured/ without a PCP. CM met patient at bedside confirmed information. Discussed Xarelto 30 day free trial card, and follow up patient is agreeable with plan. Discussed the [facility] and the services rendered, offered to schedule appointment, scheduled for 10/12 at 10:30 am with Dr. Verl Blalock. Updated S. Tripp PA-C on discharge plan, and she is amendable. No further ED CM needs identified.

## 2014-12-03 NOTE — Progress Notes (Addendum)
VASCULAR LAB PRELIMINARY  PRELIMINARY  PRELIMINARY  PRELIMINARY  Right Lower Ext. Venous Duplex Completed. Positive for acute deep vein thrombosis involving the proximal posterior tibial and peroneal veins.  Incidental findings - An acute appearing thrombus involving the right proximal tibialperoneal artery trunk.  Results given to ER charge nurse Morrison Old.  Chauncy Lean, RVT  12/03/2014, 2:26 PM

## 2014-12-03 NOTE — ED Notes (Signed)
The pt has a rx for percocet and she has taken it previously

## 2014-12-11 NOTE — ED Provider Notes (Signed)
CSN: [id number]     Arrival date & time 12/02/14  1912 History   First MD Initiated Contact with Patient 12/02/14 2205     Chief Complaint  Patient presents with  . Leg Swelling  . Leg Pain     (Consider location/radiation/quality/duration/timing/severity/associated sxs/prior Treatment) HPI   44yF with RLE pain and swelling. Onset about 3 days ago. No trauma. Progressively worsening. Pain is primarily behind her right knee and up into her calf. Reports distention of superficial veins on the same leg. No rash. Pain is somewhat worse with ambulation/movement. Denies past history of DVT.  Past Medical History  Diagnosis Date  . Addison disease   . Hypertension    Past Surgical History  Procedure Laterality Date  . Back surgery     History reviewed. No pertinent family history. Social History  Substance Use Topics  . Smoking status: Current Every Day Smoker -- 1.00 packs/day    Types: Cigarettes  . Smokeless tobacco: Never Used  . Alcohol Use: No   OB History    No data available     Review of Systems  All systems reviewed and negative, other than as noted in HPI.   Allergies  Codeine  Home Medications   Prior to Admission medications   Medication Sig Start Date End Date Taking? Authorizing Provider  aspirin-acetaminophen-caffeine (EXCEDRIN MIGRAINE) [phone] MG tablet Take 1 tablet by mouth every 6 (six) hours as needed for headache.   Yes Historical Provider, MD  Aspirin-Acetaminophen-Caffeine (GOODY HEADACHE PO) Take 1 each by mouth every 2 (two) hours as needed (knee pain).    Yes Historical Provider, MD  naproxen (NAPROSYN) 500 MG tablet Take 1 tablet (500 mg total) by mouth 2 (two) times daily. Patient not taking: Reported on 12/02/2014 08/26/14   Rolland Porter, MD  oxyCODONE-acetaminophen (PERCOCET/ROXICET) 5-325 MG per tablet Take 2 tablets by mouth every 4 (four) hours as needed. Patient not taking: Reported on 12/02/2014 08/26/14   Rolland Porter, MD   oxyCODONE-acetaminophen (PERCOCET/ROXICET) 5-325 MG tablet Take 1-2 tablets by mouth every 4 (four) hours as needed for severe pain. 12/02/14   Raeford Razor, MD  oxyCODONE-acetaminophen (PERCOCET/ROXICET) 5-325 MG tablet Take 2 tablets by mouth every 4 (four) hours as needed for severe pain. 12/03/14   Samantha Tripp Dowless, PA-C  XARELTO STARTER PACK 15 & 20 MG TBPK Take 15-20 mg by mouth as directed. Take as directed on package: Start with one  tablet by mouth twice a day with food. On Day 22, switch to one  tablet once a day with food. 12/03/14   Samantha Tripp Dowless, PA-C   BP 114/76 mmHg  Pulse 77  Temp(Src) 98.4 F (36.9 C) (Oral)  Resp 18  SpO2 96% Physical Exam  Constitutional: She appears well-developed and well-nourished. No distress.  HENT:  Head: Normocephalic and atraumatic.  Eyes: Conjunctivae are normal. Right eye exhibits no discharge. Left eye exhibits no discharge.  Neck: Neck supple.  Cardiovascular: Normal rate, regular rhythm and normal heart sounds.  Exam reveals no gallop and no friction rub.   No murmur heard. Pulmonary/Chest: Effort normal and breath sounds normal. No respiratory distress.  Abdominal: Soft. She exhibits no distension. There is no tenderness.  Musculoskeletal: She exhibits no edema or tenderness.  Right calf measures larger than the left. She is tender in her calf on palpation. Positive Homans. Prominence of superficial veins in the right leg as compared to the left. Neurovascularly intact.  Neurological: She is alert.  Skin: Skin is warm  and dry.  Psychiatric: She has a normal mood and affect. Her behavior is normal. Thought content normal.  Nursing note and vitals reviewed.   ED Course  Procedures (including critical care time) Labs Review Labs Reviewed  D-DIMER, QUANTITATIVE (NOT AT [facility]) - Abnormal; Notable for the following:    D-Dimer, Quant 0.69 (*)    All other components within normal limits  I-STAT CHEM 8, ED - Abnormal;  Notable for the following:    Potassium 3.2 (*)    Chloride 99 (*)    BUN <3 (*)    Calcium, Ion 1.07 (*)    All other components within normal limits    Imaging Review No results found. I have personally reviewed and evaluated these images and lab results as part of my medical decision-making.   EKG Interpretation None      MDM   Final diagnoses:  Right leg pain    44 year old female with atraumatic right lower extremity pain and swelling. Concern for possible DVT. Unfortunately cannot obtain ultrasound at this time a night. Will have patient return for duplex at [facility] tomorrow.    Raeford Razor, MD 12/11/14 2259

## 2014-12-15 ENCOUNTER — Ambulatory Visit: Payer: Self-pay | Admitting: Cardiology

## 2015-07-22 ENCOUNTER — Emergency Department (HOSPITAL_COMMUNITY)
Admission: EM | Admit: 2015-07-22 | Discharge: 2015-07-22 | Disposition: A | Discharge status: Home / Self Care | Payer: Self-pay | Attending: Emergency Medicine | Admitting: Emergency Medicine

## 2015-07-22 ENCOUNTER — Encounter (HOSPITAL_COMMUNITY): Payer: Self-pay | Admitting: Emergency Medicine

## 2015-07-22 ENCOUNTER — Emergency Department (HOSPITAL_COMMUNITY): Payer: Self-pay

## 2015-07-22 DIAGNOSIS — S50311A Abrasion of right elbow, initial encounter: Secondary | ICD-10-CM | POA: Insufficient documentation

## 2015-07-22 DIAGNOSIS — S4991XA Unspecified injury of right shoulder and upper arm, initial encounter: Secondary | ICD-10-CM | POA: Insufficient documentation

## 2015-07-22 DIAGNOSIS — S79911A Unspecified injury of right hip, initial encounter: Secondary | ICD-10-CM | POA: Insufficient documentation

## 2015-07-22 DIAGNOSIS — W11XXXA Fall on and from ladder, initial encounter: Secondary | ICD-10-CM | POA: Insufficient documentation

## 2015-07-22 DIAGNOSIS — F17210 Nicotine dependence, cigarettes, uncomplicated: Secondary | ICD-10-CM | POA: Insufficient documentation

## 2015-07-22 DIAGNOSIS — S0990XA Unspecified injury of head, initial encounter: Secondary | ICD-10-CM | POA: Insufficient documentation

## 2015-07-22 DIAGNOSIS — Y990 Civilian activity done for income or pay: Secondary | ICD-10-CM | POA: Insufficient documentation

## 2015-07-22 DIAGNOSIS — Y9289 Other specified places as the place of occurrence of the external cause: Secondary | ICD-10-CM | POA: Insufficient documentation

## 2015-07-22 DIAGNOSIS — I10 Essential (primary) hypertension: Secondary | ICD-10-CM | POA: Insufficient documentation

## 2015-07-22 DIAGNOSIS — S3992XA Unspecified injury of lower back, initial encounter: Secondary | ICD-10-CM | POA: Insufficient documentation

## 2015-07-22 DIAGNOSIS — Y9301 Activity, walking, marching and hiking: Secondary | ICD-10-CM | POA: Insufficient documentation

## 2015-07-22 DIAGNOSIS — Z8639 Personal history of other endocrine, nutritional and metabolic disease: Secondary | ICD-10-CM | POA: Insufficient documentation

## 2015-07-22 DIAGNOSIS — Z7982 Long term (current) use of aspirin: Secondary | ICD-10-CM | POA: Insufficient documentation

## 2015-07-22 MED ORDER — NAPROXEN 500 MG PO TABS: 500.0000 mg | ORAL_TABLET | Freq: Two times a day (BID) | ORAL | Status: DC

## 2015-07-22 MED ORDER — METHOCARBAMOL 500 MG PO TABS: 500.0000 mg | ORAL_TABLET | Freq: Two times a day (BID) | ORAL | Status: DC

## 2015-07-22 MED ORDER — IBUPROFEN 400 MG PO TABS
800.0000 mg | ORAL_TABLET | Freq: Once | ORAL | Status: AC
  Administered 2015-07-22: 800 mg via ORAL
  Filled 2015-07-22: qty 2

## 2015-07-22 NOTE — ED Notes (Signed)
Patient able to ambulate independently  

## 2015-07-22 NOTE — ED Notes (Signed)
Pt pesents to Ed for assessment of right shoulder, arm, and hip pain after a fall down approx 8 wooden steps at work.  Pt sts she tripped, denies LOC, head injury, or weakness.

## 2015-07-22 NOTE — Discharge Instructions (Signed)
Elastic Bandage and RICE °WHAT DOES AN ELASTIC BANDAGE DO? °Elastic bandages come in different shapes and sizes. They generally provide support to your injury and reduce swelling while you are healing, but they can perform different functions. Your health care provider will help you to decide what is best for your protection, recovery, or rehabilitation following an injury. °WHAT ARE SOME GENERAL TIPS FOR USING AN ELASTIC BANDAGE? °· Use the bandage as directed by the maker of the bandage that you are using. °· Do not wrap the bandage too tightly. This may cut off the circulation in the arm or leg in the area below the bandage. °¨ If part of your body beyond the bandage becomes blue, numb, cold, swollen, or is more painful, your bandage is most likely too tight. If this occurs, remove your bandage and reapply it more loosely. °· See your health care provider if the bandage seems to be making your problems worse rather than better. °· An elastic bandage should be removed and reapplied every 3-4 hours or as directed by your health care provider. °WHAT IS RICE? °The routine care of many injuries includes rest, ice, compression, and elevation (RICE therapy).  °Rest °Rest is required to allow your body to heal. Generally, you can resume your routine activities when you are comfortable and have been given permission by your health care provider. °Ice °Icing your injury helps to keep the swelling down and it reduces pain. Do not apply ice directly to your skin. °· Put ice in a plastic bag. °· Place a towel between your skin and the bag. °· Leave the ice on for 20 minutes, 2-3 times per day. °Do this for as long as you are directed by your health care provider. °Compression °Compression helps to keep swelling down, gives support, and helps with discomfort. Compression may be done with an elastic bandage. °Elevation °Elevation helps to reduce swelling and it decreases pain. If possible, your injured area should be placed at  or above the level of your heart or the center of your chest. °WHEN SHOULD I SEEK MEDICAL CARE? °You should seek medical care if: °· You have persistent pain and swelling. °· Your symptoms are getting worse rather than improving. °These symptoms may indicate that further evaluation or further X-rays are needed. Sometimes, X-rays may not show a small broken bone (fracture) until a number of days later. Make a follow-up appointment with your health care provider. Ask when your X-ray results will be ready. Make sure that you get your X-ray results. °WHEN SHOULD I SEEK IMMEDIATE MEDICAL CARE? °You should seek immediate medical care if: °· You have a sudden onset of severe pain at or below the area of your injury. °· You develop redness or increased swelling around your injury. °· You have tingling or numbness at or below the area of your injury that does not improve after you remove the elastic bandage. °  °This information is not intended to replace advice given to you by your health care provider. Make sure you discuss any questions you have with your health care provider. °  °Document Released: 08/11/2001 Document Revised: 11/10/2014 Document Reviewed: 10/05/2013 °Elsevier Interactive Patient Education ©2016 [employer] ° °

## 2015-07-22 NOTE — ED Notes (Signed)
Signature pad in room not working.  Patient verbalized understanding of discharge instructions, prescriptions, and followup care.  All questions answered.

## 2015-07-22 NOTE — ED Provider Notes (Signed)
CSN: [id number]     Arrival date & time 07/22/15  1704 History  By signing my name below, I, [facility] Inc[facility]arrissa Washington, attest that this documentation has been prepared under the direction and in the presence of Fayrene HelperBowie Lenwood Balsam, PA-C. Electronically Signed: Randell PatientMarrissa Washington, ED Scribe. 07/22/2015. 5:36 PM.   Chief Complaint  Patient presents with  . Fall  . Shoulder Pain   The history is provided by the patient. No language interpreter was used.   HPI Comments: Kristin Hickman is a 45 y.o. female with an hx of HTN who presents to the Emergency Department complaining of constant, moderate right elbow pain onset earlier today after a fall. Pt states that she was walking down a set of wet wooden steps when she slipped and fell backwards, sliding down the remaining 7 steps and striking her head, right shoulder, right arm, and right hip on the steps. She reports right shoulder, right elbow, and right hip pain after this fall. Pain is worse with movement and palpation. Pain is moderate in severity.  She has not taken any medications or attempted any treatments. She is allergic to codeine. Denies numbness.  Past Medical History  Diagnosis Date  . Addison disease (HCC)   . Hypertension    Past Surgical History  Procedure Laterality Date  . Back surgery     History reviewed. No pertinent family history. Social History  Substance Use Topics  . Smoking status: Current Every Day Smoker -- 1.00 packs/day    Types: Cigarettes  . Smokeless tobacco: Never Used  . Alcohol Use: No   OB History    No data available     Review of Systems  Musculoskeletal: Positive for myalgias and arthralgias.  Neurological: Negative for numbness.    Allergies  Codeine  Home Medications   Prior to Admission medications   Medication Sig Start Date End Date Taking? Authorizing Provider  aspirin-acetaminophen-caffeine (EXCEDRIN MIGRAINE) [phone] MG tablet Take 1 tablet by mouth every 6 (six) hours as needed for  headache.    Historical Provider, MD  Aspirin-Acetaminophen-Caffeine (GOODY HEADACHE PO) Take 1 each by mouth every 2 (two) hours as needed (knee pain).     Historical Provider, MD  naproxen (NAPROSYN) 500 MG tablet Take 1 tablet (500 mg total) by mouth 2 (two) times daily. Patient not taking: Reported on 12/02/2014 08/26/14   Rolland PorterMark James, MD  oxyCODONE-acetaminophen (PERCOCET/ROXICET) 5-325 MG per tablet Take 2 tablets by mouth every 4 (four) hours as needed. Patient not taking: Reported on 12/02/2014 08/26/14   Rolland PorterMark James, MD  oxyCODONE-acetaminophen (PERCOCET/ROXICET) 5-325 MG tablet Take 1-2 tablets by mouth every 4 (four) hours as needed for severe pain. 12/02/14   Raeford RazorStephen Kohut, MD  oxyCODONE-acetaminophen (PERCOCET/ROXICET) 5-325 MG tablet Take 2 tablets by mouth every 4 (four) hours as needed for severe pain. 12/03/14   Samantha Tripp Dowless, PA-C  XARELTO STARTER PACK 15 & 20 MG TBPK Take 15-20 mg by mouth as directed. Take as directed on package: Start with one 15mg  tablet by mouth twice a day with food. On Day 22, switch to one 20mg  tablet once a day with food. 12/03/14   Samantha Tripp Dowless, PA-C   BP 151/91 mmHg  Pulse 85  Temp(Src) 98.5 F (36.9 C) (Oral)  Resp 20  SpO2 98%  LMP 07/20/2015 Physical Exam  Constitutional: She is oriented to person, place, and time. She appears well-developed and well-nourished. No distress.  HENT:  Head: Normocephalic and atraumatic.  Mild tenderness to posterior scalp without  bruising.   Eyes: Conjunctivae are normal.  Neck: Normal range of motion.  Cardiovascular: Normal rate.   Pulmonary/Chest: Effort normal. No respiratory distress.  Musculoskeletal: Normal range of motion. She exhibits tenderness.  Tenderness of the L-spine over the L-3 and L-4 without crepitus or step-offs. On right shoulder, tenderness noted to lateral deltoid at glenoid-humeral region without gross deformity noted. On right elbow, tenderness over the posterior medial  lateral epicondyl with a small abrasion noted. Decreased elbow extension secondary to pain. Right wrist FROM. Sensation intact.  Neurological: She is alert and oriented to person, place, and time.  Skin: Skin is warm and dry. Abrasion noted.  Psychiatric: She has a normal mood and affect. Her behavior is normal.  Nursing note and vitals reviewed.   ED Course  Procedures  DIAGNOSTIC STUDIES: Oxygen Saturation is 98% on RA, normal by my interpretation.    COORDINATION OF CARE: 5:24 PM Will order ibuprofen, right shoulder x-ray, right elbow x-ray, and sling. Discussed treatment plan with pt at bedside and pt agreed to plan.  7:05 PM Xray of R shoulder and R elbow are negative for acute fx/dislocation.  Pt is NVI.  She is not longer taking Xarelto.  Will provide RICE treatment, and ortho referral as needed.   Imaging Review Dg Shoulder Right  07/22/2015  CLINICAL DATA:  Pain after trauma EXAM: RIGHT SHOULDER - 2+ VIEW COMPARISON:  None. FINDINGS: There is no evidence of fracture or dislocation. There is no evidence of arthropathy or other focal bone abnormality. Soft tissues are unremarkable. IMPRESSION: Negative. Electronically Signed   By: Gerome Sam III M.D   On: 07/22/2015 18:35   Dg Elbow Complete Right  07/22/2015  CLINICAL DATA:  Fall today, elbow pain. EXAM: RIGHT ELBOW - COMPLETE 3+ VIEW COMPARISON:  None. FINDINGS: Osseous alignment is normal. Bone mineralization is normal. No fracture line or displaced fracture fragment seen. No evidence of joint effusion. Adjacent soft tissues are unremarkable. IMPRESSION: Negative. Electronically Signed   By: Bary Richard M.D.   On: 07/22/2015 18:34   I have personally reviewed and evaluated these images as part of my medical decision-making.   MDM   Final diagnoses:  Fall from ladder, initial encounter  Injury of right upper arm, initial encounter    BP 151/91 mmHg  Pulse 85  Temp(Src) 98.5 F (36.9 C) (Oral)  Resp 20  SpO2  98%  LMP 07/20/2015   I personally performed the services described in this documentation, which was scribed in my presence. The recorded information has been reviewed and is accurate.     Fayrene Helper, PA-C 07/22/15 1906  Donnetta Hutching, MD 07/22/15 [phone]

## 2015-07-22 NOTE — ED Notes (Signed)
Registration at bedside.

## 2015-09-24 ENCOUNTER — Emergency Department (HOSPITAL_COMMUNITY): Payer: Self-pay

## 2015-09-24 ENCOUNTER — Emergency Department (HOSPITAL_COMMUNITY)
Admission: EM | Admit: 2015-09-24 | Discharge: 2015-09-25 | Disposition: A | Discharge status: Home / Self Care | Payer: Self-pay | Attending: Emergency Medicine | Admitting: Emergency Medicine

## 2015-09-24 ENCOUNTER — Emergency Department (HOSPITAL_BASED_OUTPATIENT_CLINIC_OR_DEPARTMENT_OTHER): Admit: 2015-09-24 | Discharge: 2015-09-24 | Disposition: A | Discharge status: Home / Self Care | Payer: Self-pay

## 2015-09-24 ENCOUNTER — Encounter (HOSPITAL_COMMUNITY): Payer: Self-pay | Admitting: Emergency Medicine

## 2015-09-24 DIAGNOSIS — Z7982 Long term (current) use of aspirin: Secondary | ICD-10-CM | POA: Insufficient documentation

## 2015-09-24 DIAGNOSIS — F17210 Nicotine dependence, cigarettes, uncomplicated: Secondary | ICD-10-CM | POA: Insufficient documentation

## 2015-09-24 DIAGNOSIS — M79609 Pain in unspecified limb: Secondary | ICD-10-CM

## 2015-09-24 DIAGNOSIS — R0602 Shortness of breath: Secondary | ICD-10-CM | POA: Insufficient documentation

## 2015-09-24 DIAGNOSIS — I10 Essential (primary) hypertension: Secondary | ICD-10-CM | POA: Insufficient documentation

## 2015-09-24 DIAGNOSIS — I8001 Phlebitis and thrombophlebitis of superficial vessels of right lower extremity: Secondary | ICD-10-CM | POA: Insufficient documentation

## 2015-09-24 DIAGNOSIS — Z79899 Other long term (current) drug therapy: Secondary | ICD-10-CM | POA: Insufficient documentation

## 2015-09-24 LAB — CBC
HEMATOCRIT: 36.7 % (ref 36.0–46.0)
HEMOGLOBIN: 11.7 g/dL — AB (ref 12.0–15.0)
MCH: 28.7 pg (ref 26.0–34.0)
MCHC: 31.9 g/dL (ref 30.0–36.0)
MCV: 90.2 fL (ref 78.0–100.0)
Platelets: 213 10*3/uL (ref 150–400)
RBC: 4.07 MIL/uL (ref 3.87–5.11)
RDW: 16.8 % — ABNORMAL HIGH (ref 11.5–15.5)
WBC: 7.9 10*3/uL (ref 4.0–10.5)

## 2015-09-24 LAB — BASIC METABOLIC PANEL
ANION GAP: 6 (ref 5–15)
BUN: <5 mg/dL — ABNORMAL LOW (ref 6–20)
CO2: 27 mmol/L (ref 22–32)
Calcium: 8.4 mg/dL — ABNORMAL LOW (ref 8.9–10.3)
Chloride: 102 mmol/L (ref 101–111)
Creatinine, Ser: 0.8 mg/dL (ref 0.44–1.00)
Glucose, Bld: 86 mg/dL (ref 65–99)
POTASSIUM: 3.4 mmol/L — AB (ref 3.5–5.1)
SODIUM: 135 mmol/L (ref 135–145)

## 2015-09-24 LAB — TROPONIN I: Troponin I: <0.03 ng/mL (ref <0.03)

## 2015-09-24 MED ORDER — FENTANYL CITRATE (PF) 100 MCG/2ML IJ SOLN
50.0000 ug | INTRAMUSCULAR | Status: DC | PRN
  Administered 2015-09-24: 50 ug via INTRAVENOUS
  Filled 2015-09-24: qty 2

## 2015-09-24 MED ORDER — SODIUM CHLORIDE 0.9 % IV SOLN
Freq: Once | INTRAVENOUS | Status: AC
  Administered 2015-09-24: 22:00:00 via INTRAVENOUS

## 2015-09-24 MED ORDER — IOPAMIDOL (ISOVUE-370) INJECTION 76%
INTRAVENOUS | Status: AC
  Administered 2015-09-24: 60 mL
  Filled 2015-09-24: qty 100

## 2015-09-24 NOTE — ED Notes (Signed)
Pt c/o redness to R foot x 2 days with intermittent numbness in foot. Bruising noted to ankle and foot, denies injury. Tightness noted to mid calf with redness extending into R thigh. Hx of blood clot in R leg in fall 2016, does not take blood thinners. Pt also reports feeling unable to take a full deep breath, denies pain, does have chest tightness.

## 2015-09-24 NOTE — Progress Notes (Signed)
VASCULAR LAB PRELIMINARY  PRELIMINARY  PRELIMINARY  PRELIMINARY  Right lower extremity venous duplex completed.    Preliminary report:  There is no DVT noted in the right lower extremity.  There is superficial thrombosis noted in the right greater saphenous vein from mid calf to mid thigh and in a branch that runs to the anterior thigh.  Gave report to Fayrene Helper, PA-C  Marysue Fait, RVT 09/24/2015, 8:06 PM

## 2015-09-24 NOTE — Discharge Instructions (Signed)
If you were given medicines take as directed.  If you are on coumadin or contraceptives realize their levels and effectiveness is altered by many different medicines.  If you have any reaction (rash, tongues swelling, other) to the medicines stop taking and see a physician.    If your blood pressure was elevated in the ER make sure you follow up for management with a primary doctor or return for chest pain, shortness of breath or stroke symptoms.  Please follow up as directed and return to the ER or see a physician for new or worsening symptoms.  Thank you. Filed Vitals:   09/24/15 1655 09/24/15 2047  BP: 143/88 147/95  Pulse: 92 80  Temp: 98.2 F (36.8 C)   TempSrc: Oral   Resp: 18 14  SpO2: 97% 96%

## 2015-09-24 NOTE — ED Notes (Signed)
CT contacted to inform that Bun/creatinine has resulted

## 2015-09-24 NOTE — ED Notes (Signed)
Pt having US done at this time

## 2015-09-24 NOTE — ED Provider Notes (Signed)
CSN: [id number]     Arrival date & time 09/24/15  1602 History   First MD Initiated Contact with Patient 09/24/15 2056     Chief Complaint  Patient presents with  . Shortness of Breath  . Leg Pain     (Consider location/radiation/quality/duration/timing/severity/associated sxs/prior Treatment) HPI Comments: 45 year old female with history of cigarette smoking, blood clot 2 years ago without definitive cause not currently on anticoagulants presents with shortness of breath and right leg pain and swelling. Patient the past 2 days had mild erythema swelling tenderness to the right lower leg. No injuries. No fevers or chills. Patient's had mild shortness of breath during that time as well no significant chest pain or abdominal pain. No cardiac history other significant family history of cardiac and death secondary to cardiac disease.  Patient is a 45 y.o. female presenting with shortness of breath and leg pain. The history is provided by the patient.  Shortness of Breath Associated symptoms: no abdominal pain, no chest pain, no fever, no headaches, no neck pain, no rash and no vomiting   Leg Pain Associated symptoms: no back pain, no fever and no neck pain     Past Medical History  Diagnosis Date  . Addison disease (HCC)   . Hypertension    Past Surgical History  Procedure Laterality Date  . Back surgery     History reviewed. No pertinent family history. Social History  Substance Use Topics  . Smoking status: Current Every Day Smoker -- 0.50 packs/day    Types: Cigarettes  . Smokeless tobacco: Never Used  . Alcohol Use: No   OB History    No data available     Review of Systems  Constitutional: Negative for fever and chills.  HENT: Negative for congestion.   Eyes: Negative for visual disturbance.  Respiratory: Positive for shortness of breath.   Cardiovascular: Positive for leg swelling. Negative for chest pain.  Gastrointestinal: Negative for vomiting and abdominal pain.   Genitourinary: Negative for dysuria and flank pain.  Musculoskeletal: Negative for back pain, neck pain and neck stiffness.  Skin: Negative for rash.  Neurological: Negative for light-headedness and headaches.      Allergies  Codeine  Home Medications   Prior to Admission medications   Medication Sig Start Date End Date Taking? Authorizing Provider  acetaminophen (TYLENOL) 500 MG tablet Take 500 mg by mouth daily as needed for mild pain or headache.   Yes Historical Provider, MD  Aspirin-Acetaminophen-Caffeine (GOODY HEADACHE PO) Take 1 each by mouth every 2 (two) hours as needed (knee pain).    Yes Historical Provider, MD   BP 139/96 mmHg  Pulse 75  Temp(Src) 98.2 F (36.8 C) (Oral)  Resp 14  SpO2 97%  LMP 09/22/2015 Physical Exam  Constitutional: She is oriented to person, place, and time. She appears well-developed and well-nourished.  HENT:  Head: Normocephalic and atraumatic.  Eyes: Conjunctivae are normal. Right eye exhibits no discharge. Left eye exhibits no discharge.  Neck: Normal range of motion. Neck supple. No tracheal deviation present.  Cardiovascular: Normal rate and regular rhythm.   Pulmonary/Chest: Effort normal and breath sounds normal.  Abdominal: Soft. She exhibits no distension. There is no tenderness. There is no guarding.  Musculoskeletal: She exhibits edema and tenderness.  Patient has mild edema right lower extremity mild erythema on the medial proximal and anterior lower thigh following superficial vein.  Neurological: She is alert and oriented to person, place, and time.  Skin: Skin is warm. No rash noted.  Psychiatric: She has a normal mood and affect.  Nursing note and vitals reviewed.   ED Course  Procedures (including critical care time) Labs Review Labs Reviewed  BASIC METABOLIC PANEL - Abnormal; Notable for the following:    Potassium 3.4 (*)    BUN <5 (*)    Calcium 8.4 (*)    All other components within normal limits  CBC -  Abnormal; Notable for the following:    Hemoglobin 11.7 (*)    RDW 16.8 (*)    All other components within normal limits  TROPONIN I    Imaging Review Dg Chest 2 View  09/24/2015  CLINICAL DATA:  Shortness of breath for 1 hour. History of blood clot in right leg. EXAM: CHEST  2 VIEW COMPARISON:  None. FINDINGS: Heart size is normal. Lungs are clear. No pleural effusion or pneumothorax seen. Mild degenerative spurring noted within the thoracic spine. No acute-appearing osseous abnormality. IMPRESSION: No active cardiopulmonary disease.  Lungs are clear. Electronically Signed   By: Bary Richard M.D.   On: 09/24/2015 16:54   Ct Angio Chest Pe W/cm &/or Wo Cm  09/24/2015  CLINICAL DATA:  Shortness of breath and leg swelling. History of pulmonary embolism. EXAM: CT ANGIOGRAPHY CHEST WITH CONTRAST TECHNIQUE: Multidetector CT imaging of the chest was performed using the standard protocol during bolus administration of intravenous contrast. Multiplanar CT image reconstructions and MIPs were obtained to evaluate the vascular anatomy. CONTRAST:  60 cc Isovue 370 intravenous COMPARISON:  None available (09/24/2011 FINDINGS: Cardiovascular: No evidence of acute pulmonary embolism. Normal thoracic aorta. Normal heart size. No pericardial effusion. Mediastinum:  Negative for adenopathy. Lungs/Pleura: Diffuse airway thickening. There is no edema, consolidation, effusion, or pneumothorax. Upper abdomen: No acute findings.  Marked hepatic steatosis. Musculoskeletal: Negative. Review of the MIP images confirms the above findings. IMPRESSION: 1. No evidence of acute pulmonary embolism. 2. Bronchitis, chronicity indeterminate. 3. Marked hepatic steatosis. Electronically Signed   By: Marnee Spring M.D.   On: 09/24/2015 23:36   I have personally reviewed and evaluated these images and lab results as part of my medical decision-making.   EKG Interpretation   Date/Time:  Saturday September 24 2015 16:13:04 EDT Ventricular  Rate:  87 PR Interval:  142 QRS Duration: 88 QT Interval:  386 QTC Calculation: 464 R Axis:   75 Text Interpretation:  Normal sinus rhythm Confirmed by Chanee Henrickson MD, Ivin Booty  [phone]) on 09/24/2015 9:33:40 PM      MDM   Final diagnoses:  SOB (shortness of breath)  Superficial thrombophlebitis of right leg   Concern for blood clot as cause of leg pain and swelling, ultrasound performed showing superficial blood clot noted deep vein clot. With blood clot history and also shortness of breath plan for CT scan of the chest for further details. Blood work ordered. Pain medicines ordered CT scan of the chest negative for blood clot. Patient improved in the ER. Vital signs overall unremarkable mild elevated blood pressure.  Results and differential diagnosis were discussed with the patient/parent/guardian. Xrays were independently reviewed by myself.  Close follow up outpatient was discussed, comfortable with the plan.   Medications  fentaNYL (SUBLIMAZE) injection 50 mcg (50 mcg Intravenous Given 09/24/15 2148)  0.9 %  sodium chloride infusion ( Intravenous New Bag/Given 09/24/15 2148)  iopamidol (ISOVUE-370) 76 % injection (60 mLs  Contrast Given 09/24/15 2256)    Filed Vitals:   09/24/15 1655 09/24/15 2047 09/24/15 2230  BP: 143/88 147/95 139/96  Pulse: 92 80 75  Temp: 98.2 F (36.8 C)    TempSrc: Oral    Resp: 18 14 14   SpO2: 97% 96% 97%    Final diagnoses:  SOB (shortness of breath)  Superficial thrombophlebitis of right leg         Blane Ohara, MD 09/25/15 9678

## 2015-09-24 NOTE — ED Notes (Signed)
Pt presents to ED for assessment of right leg/thigh redness, swelling, and pain.  Pt sts hx of blood clot in same leg.  Symptoms began 2 days ago, pt sts increasing ever since.  Pt also sts she does not feel like she can get a full breath.  Experiencing some anxiety.  97% on RA.

## 2016-04-26 ENCOUNTER — Emergency Department (HOSPITAL_COMMUNITY)
Admission: EM | Admit: 2016-04-26 | Discharge: 2016-04-27 | Disposition: A | Discharge status: Home / Self Care | Payer: Self-pay | Attending: Emergency Medicine | Admitting: Emergency Medicine

## 2016-04-26 ENCOUNTER — Encounter (HOSPITAL_COMMUNITY): Payer: Self-pay

## 2016-04-26 DIAGNOSIS — F17210 Nicotine dependence, cigarettes, uncomplicated: Secondary | ICD-10-CM | POA: Insufficient documentation

## 2016-04-26 DIAGNOSIS — K047 Periapical abscess without sinus: Secondary | ICD-10-CM | POA: Insufficient documentation

## 2016-04-26 DIAGNOSIS — I10 Essential (primary) hypertension: Secondary | ICD-10-CM | POA: Insufficient documentation

## 2016-04-26 DIAGNOSIS — Z79899 Other long term (current) drug therapy: Secondary | ICD-10-CM | POA: Insufficient documentation

## 2016-04-26 MED ORDER — ONDANSETRON HCL 4 MG/2ML IJ SOLN
4.0000 mg | Freq: Once | INTRAMUSCULAR | Status: AC
  Administered 2016-04-27: 4 mg via INTRAVENOUS
  Filled 2016-04-26: qty 2

## 2016-04-26 MED ORDER — MORPHINE SULFATE (PF) 4 MG/ML IV SOLN
4.0000 mg | Freq: Once | INTRAVENOUS | Status: DC
  Filled 2016-04-26: qty 1

## 2016-04-26 MED ORDER — CLINDAMYCIN PHOSPHATE 600 MG/50ML IV SOLN
600.0000 mg | Freq: Once | INTRAVENOUS | Status: AC
  Administered 2016-04-27: 600 mg via INTRAVENOUS
  Filled 2016-04-26: qty 50

## 2016-04-26 NOTE — ED Triage Notes (Signed)
Right sided facial swelling since this am no drooling noted clear speech noted states has bad teeth on right side no new medication voiced.

## 2016-04-27 ENCOUNTER — Telehealth: Payer: Self-pay | Admitting: *Deleted

## 2016-04-27 ENCOUNTER — Encounter: Payer: Self-pay | Admitting: *Deleted

## 2016-04-27 LAB — CBC WITH DIFFERENTIAL/PLATELET
BASOS ABS: 0 10*3/uL (ref 0.0–0.1)
BASOS PCT: 0 %
Eosinophils Absolute: 0.1 10*3/uL (ref 0.0–0.7)
Eosinophils Relative: 1 %
HEMATOCRIT: 37.8 % (ref 36.0–46.0)
HEMOGLOBIN: 12.4 g/dL (ref 12.0–15.0)
LYMPHS PCT: 16 %
Lymphs Abs: 1.8 10*3/uL (ref 0.7–4.0)
MCH: 28.9 pg (ref 26.0–34.0)
MCHC: 32.8 g/dL (ref 30.0–36.0)
MCV: 88.1 fL (ref 78.0–100.0)
MONO ABS: 0.7 10*3/uL (ref 0.1–1.0)
Monocytes Relative: 7 %
NEUTROS ABS: 8.3 10*3/uL — AB (ref 1.7–7.7)
NEUTROS PCT: 76 %
Platelets: 271 10*3/uL (ref 150–400)
RBC: 4.29 MIL/uL (ref 3.87–5.11)
RDW: 15.2 % (ref 11.5–15.5)
WBC: 10.9 10*3/uL — AB (ref 4.0–10.5)

## 2016-04-27 LAB — BASIC METABOLIC PANEL
ANION GAP: 10 (ref 5–15)
BUN: <5 mg/dL — ABNORMAL LOW (ref 6–20)
CO2: 25 mmol/L (ref 22–32)
Calcium: 8.8 mg/dL — ABNORMAL LOW (ref 8.9–10.3)
Chloride: 101 mmol/L (ref 101–111)
Creatinine, Ser: 0.81 mg/dL (ref 0.44–1.00)
GFR calc Af Amer: >60 mL/min (ref >60)
GFR calc non Af Amer: >60 mL/min (ref >60)
GLUCOSE: 86 mg/dL (ref 65–99)
POTASSIUM: 4.3 mmol/L (ref 3.5–5.1)
Sodium: 136 mmol/L (ref 135–145)

## 2016-04-27 MED ORDER — KETOROLAC TROMETHAMINE 30 MG/ML IJ SOLN
30.0000 mg | Freq: Once | INTRAMUSCULAR | Status: AC
  Administered 2016-04-27: 30 mg via INTRAVENOUS
  Filled 2016-04-27: qty 1

## 2016-04-27 MED ORDER — DEXAMETHASONE SODIUM PHOSPHATE 10 MG/ML IJ SOLN
10.0000 mg | Freq: Once | INTRAMUSCULAR | Status: AC
  Administered 2016-04-27: 10 mg via INTRAVENOUS
  Filled 2016-04-27: qty 1

## 2016-04-27 MED ORDER — AMOXICILLIN 500 MG PO CAPS: 1000.0000 mg | ORAL_CAPSULE | Freq: Two times a day (BID) | ORAL | 0 refills | Status: DC

## 2016-04-27 MED ORDER — HYDROMORPHONE HCL 1 MG/ML IJ SOLN
0.5000 mg | Freq: Once | INTRAMUSCULAR | Status: AC
  Administered 2016-04-27: 0.5 mg via INTRAVENOUS
  Filled 2016-04-27: qty 1

## 2016-04-27 MED ORDER — SODIUM CHLORIDE 0.9 % IV BOLUS (SEPSIS)
1000.0000 mL | Freq: Once | INTRAVENOUS | Status: AC
  Administered 2016-04-27: 1000 mL via INTRAVENOUS

## 2016-04-27 MED ORDER — METRONIDAZOLE 500 MG PO TABS: 500.0000 mg | ORAL_TABLET | Freq: Two times a day (BID) | ORAL | 0 refills | Status: DC

## 2016-04-27 MED ORDER — OXYCODONE-ACETAMINOPHEN 5-325 MG PO TABS: 1.0000 | ORAL_TABLET | ORAL | 0 refills | Status: DC | PRN

## 2016-04-27 NOTE — ED Provider Notes (Signed)
[facility]-EMERGENCY DEPT Provider Note   CSN: [id number] Arrival date & time: 04/26/16  2209     History   Chief Complaint Chief Complaint  Patient presents with  . Facial Swelling    right side of face since this am.      HPI  Blood pressure 135/87, pulse 81, temperature 98.3 F (36.8 C), temperature source Oral, resp. rate 20, height 5\' 2"  (1.575 m), weight 77.1 kg, SpO2 96 %.  Kristin Hickman is a 46 y.o. female complaining of right-sided facial pain x 1 day.  Patient states that the pain started "late last night". The right side of her mouth was swollen when she woke up this morning and has since spread to her right cheek and crossed mid-line of her mouth.  She says the pain is "really bad" and describes it as a throbbing pressure.  She has never had anything like this before.  She states she had a sore throat 2 days prior, but that pain was resolved w/ OTC analgesics.  Patient reports trying ibuprofen and BC powder today with no relief.  Pain is worsened with movement, touch, and eating.  Patient denies fever, chills, headache, visual changes, otalgia, sinus pain, rhinorrhea, dysphagia, and cough.  Past Medical History:  Diagnosis Date  . Addison disease (HCC)   . Hypertension     There are no active problems to display for this patient.   Past Surgical History:  Procedure Laterality Date  . BACK SURGERY      OB History    No data available       Home Medications    Prior to Admission medications   Medication Sig Start Date End Date Taking? Authorizing Provider  acetaminophen (TYLENOL) 500 MG tablet Take 500 mg by mouth daily as needed for mild pain or headache.   Yes Historical Provider, MD  Aspirin-Acetaminophen-Caffeine (GOODY HEADACHE PO) Take 1 each by mouth every 2 (two) hours as needed (knee pain).    Yes Historical Provider, MD  ibuprofen (ADVIL,MOTRIN) 200 MG tablet Take 400 mg by mouth every 6 (six) hours as needed for moderate pain.   Yes Historical  Provider, MD  amoxicillin (AMOXIL) 500 MG capsule Take 2 capsules (1,000 mg total) by mouth 2 (two) times daily. 04/27/16   Laniyah Rosenwald, PA-C  metroNIDAZOLE (FLAGYL) 500 MG tablet Take 1 tablet (500 mg total) by mouth 2 (two) times daily. One po bid x 7 days 04/27/16   Joni Reining Talesha Ellithorpe, PA-C  oxyCODONE-acetaminophen (PERCOCET) 5-325 MG tablet Take 1-2 tablets by mouth every 4 (four) hours as needed. 04/27/16   Wynetta Emery, PA-C    Family History History reviewed. No pertinent family history.  Social History Social History  Substance Use Topics  . Smoking status: Current Every Day Smoker    Packs/day: 0.50    Types: Cigarettes  . Smokeless tobacco: Never Used  . Alcohol use No     Allergies   Codeine   Review of Systems Review of Systems  10 systems reviewed and found to be negative, except as noted in the HPI.   Physical Exam Updated Vital Signs BP 135/87 (BP Location: Right Arm)   Pulse 81   Temp 98.3 F (36.8 C) (Oral)   Resp 20   Ht 5\' 2"  (1.575 m)   Wt 77.1 kg   SpO2 96%   BMI 31.09 kg/m   Physical Exam  Constitutional: She is oriented to person, place, and time. She appears well-developed and well-nourished. No distress.  HENT:  Head: Atraumatic.  Mouth/Throat: Oropharynx is clear and moist.  Patent airway, swallowing her secretions right-sided facial swelling with no overlying cellulitis, does not extend to the lower eyelid.  Eyes: Conjunctivae and EOM are normal. Pupils are equal, round, and reactive to light.  Neck: Normal range of motion.  Cardiovascular: Normal rate, regular rhythm and intact distal pulses.   Pulmonary/Chest: Effort normal and breath sounds normal.  Abdominal: Soft. There is no tenderness.  Musculoskeletal: Normal range of motion.  Neurological: She is alert and oriented to person, place, and time.  Skin: She is not diaphoretic.  Psychiatric: She has a normal mood and affect.  Nursing note and vitals reviewed.    ED  Treatments / Results  Labs (all labs ordered are listed, but only abnormal results are displayed) Labs Reviewed  CBC WITH DIFFERENTIAL/PLATELET - Abnormal; Notable for the following:       Result Value   WBC 10.9 (*)    Neutro Abs 8.3 (*)    All other components within normal limits  BASIC METABOLIC PANEL - Abnormal; Notable for the following:    BUN <5 (*)    Calcium 8.8 (*)    All other components within normal limits    EKG  EKG Interpretation None       Radiology No results found.  Procedures Procedures (including critical care time)  Medications Ordered in ED Medications  sodium chloride 0.9 % bolus 1,000 mL (1,000 mLs Intravenous New Bag/Given 04/27/16 0112)  ondansetron (ZOFRAN) injection 4 mg (4 mg Intravenous Given 04/27/16 0035)  clindamycin (CLEOCIN) IVPB 600 mg (600 mg Intravenous New Bag/Given 04/27/16 0056)  HYDROmorphone (DILAUDID) injection 0.5 mg (0.5 mg Intravenous Given 04/27/16 0035)  ketorolac (TORADOL) 30 MG/ML injection 30 mg (30 mg Intravenous Given 04/27/16 0039)  dexamethasone (DECADRON) injection 10 mg (10 mg Intravenous Given 04/27/16 0041)     Initial Impression / Assessment and Plan / ED Course  I have reviewed the triage vital signs and the nursing notes.  Pertinent labs & imaging results that were available during my care of the patient were reviewed by me and considered in my medical decision making (see chart for details).     Vitals:   04/26/16 2213 04/26/16 2214 04/26/16 2233 04/27/16 0045  BP: (!) 143/106   135/87  Pulse: 85   81  Resp: 20   20  Temp: 98.3 F (36.8 C)   98.3 F (36.8 C)  TempSrc: Oral   Oral  SpO2: 96%  96% 96%  Weight:  77.1 kg    Height:  5\' 2"  (1.575 m)      Medications  sodium chloride 0.9 % bolus 1,000 mL (1,000 mLs Intravenous New Bag/Given 04/27/16 0112)  ondansetron (ZOFRAN) injection 4 mg (4 mg Intravenous Given 04/27/16 0035)  clindamycin (CLEOCIN) IVPB 600 mg (600 mg Intravenous New Bag/Given  04/27/16 0056)  HYDROmorphone (DILAUDID) injection 0.5 mg (0.5 mg Intravenous Given 04/27/16 0035)  ketorolac (TORADOL) 30 MG/ML injection 30 mg (30 mg Intravenous Given 04/27/16 0039)  dexamethasone (DECADRON) injection 10 mg (10 mg Intravenous Given 04/27/16 0041)    Kristin Hickman is 46 y.o. female presenting with Dental abscess and facial swelling. She has a fear of the dentist. Will not allow incision and drainage. Mild leukocytosis. This does not appear to be a deep space infection, does not affect the airway, it does not reach the eye. Patient given clindamycin, case management consulted to help this patient obtain primary care, extensive discussion of  return precautions and dental resource guide provided  Evaluation does not show pathology that would require ongoing emergent intervention or inpatient treatment. Pt is hemodynamically stable and mentating appropriately. Discussed findings and plan with patient/guardian, who agrees with care plan. All questions answered. Return precautions discussed and outpatient follow up given.      Final Clinical Impressions(s) / ED Diagnoses   Final diagnoses:  Dental abscess    New Prescriptions New Prescriptions   AMOXICILLIN (AMOXIL) 500 MG CAPSULE    Take 2 capsules (1,000 mg total) by mouth 2 (two) times daily.   METRONIDAZOLE (FLAGYL) 500 MG TABLET    Take 1 tablet (500 mg total) by mouth 2 (two) times daily. One po bid x 7 days   OXYCODONE-ACETAMINOPHEN (PERCOCET) 5-325 MG TABLET    Take 1-2 tablets by mouth every 4 (four) hours as needed.     Joni Reining Azell Bill, PA-C 04/27/16 1610    Pricilla Loveless, MD 04/30/16 [phone]

## 2016-04-27 NOTE — Telephone Encounter (Signed)
[facility] help [facility] find [facility].  EDCM called pt and gave her number for [facility] [phone] and [facility] [phone] to call for appointment.  EDCM also left referral for [facility] and Scherry RanKaren Andrianos, [facility] to follow up with for the orange card program.

## 2016-04-27 NOTE — Discharge Instructions (Signed)
Take percocet for breakthrough pain, do not drink alcohol, drive, care for children or do other critical tasks while taking percocet.  Do not drink alcohol while you are taking flagyl (metronidazole) because it will make you very sick.  Do not hesitate to return to the Emergency Department for any new, worsening or concerning symptoms.   If you do not have a primary care doctor you can establish one at the   [facility]: [address] [place] [id number] [phone]  Asked them how to apply for the orange card. It is best to go at about 7: 45 on Thursday morning.  After you establish care. Let them know you were seen in the emergency room. They must obtain records for further management.

## 2016-04-28 ENCOUNTER — Emergency Department (HOSPITAL_COMMUNITY)
Admission: EM | Admit: 2016-04-28 | Discharge: 2016-04-28 | Disposition: A | Discharge status: Home / Self Care | Payer: Self-pay | Attending: Emergency Medicine | Admitting: Emergency Medicine

## 2016-04-28 ENCOUNTER — Emergency Department (HOSPITAL_COMMUNITY): Payer: Self-pay

## 2016-04-28 ENCOUNTER — Encounter (HOSPITAL_COMMUNITY): Payer: Self-pay

## 2016-04-28 DIAGNOSIS — K047 Periapical abscess without sinus: Secondary | ICD-10-CM | POA: Insufficient documentation

## 2016-04-28 DIAGNOSIS — F17210 Nicotine dependence, cigarettes, uncomplicated: Secondary | ICD-10-CM | POA: Insufficient documentation

## 2016-04-28 DIAGNOSIS — Z79899 Other long term (current) drug therapy: Secondary | ICD-10-CM | POA: Insufficient documentation

## 2016-04-28 DIAGNOSIS — I10 Essential (primary) hypertension: Secondary | ICD-10-CM | POA: Insufficient documentation

## 2016-04-28 LAB — BASIC METABOLIC PANEL
Anion gap: 5 (ref 5–15)
BUN: 8 mg/dL (ref 6–20)
CHLORIDE: 104 mmol/L (ref 101–111)
CO2: 30 mmol/L (ref 22–32)
Calcium: 8.9 mg/dL (ref 8.9–10.3)
Creatinine, Ser: 0.78 mg/dL (ref 0.44–1.00)
GFR calc Af Amer: >60 mL/min (ref >60)
GFR calc non Af Amer: >60 mL/min (ref >60)
GLUCOSE: 79 mg/dL (ref 65–99)
POTASSIUM: 3.4 mmol/L — AB (ref 3.5–5.1)
SODIUM: 139 mmol/L (ref 135–145)

## 2016-04-28 LAB — CBC
HEMATOCRIT: 36.2 % (ref 36.0–46.0)
HEMOGLOBIN: 11.6 g/dL — AB (ref 12.0–15.0)
MCH: 28.3 pg (ref 26.0–34.0)
MCHC: 32 g/dL (ref 30.0–36.0)
MCV: 88.3 fL (ref 78.0–100.0)
Platelets: 238 10*3/uL (ref 150–400)
RBC: 4.1 MIL/uL (ref 3.87–5.11)
RDW: 15.1 % (ref 11.5–15.5)
WBC: 12 10*3/uL — ABNORMAL HIGH (ref 4.0–10.5)

## 2016-04-28 MED ORDER — SODIUM CHLORIDE 0.9 % IV BOLUS (SEPSIS)
1000.0000 mL | Freq: Once | INTRAVENOUS | Status: AC
  Administered 2016-04-28: 1000 mL via INTRAVENOUS

## 2016-04-28 MED ORDER — HYDROMORPHONE HCL 1 MG/ML IJ SOLN
1.0000 mg | Freq: Once | INTRAMUSCULAR | Status: AC
  Administered 2016-04-28: 1 mg via INTRAVENOUS
  Filled 2016-04-28: qty 1

## 2016-04-28 MED ORDER — LIDOCAINE-EPINEPHRINE (PF) 2 %-1:200000 IJ SOLN
20.0000 mL | Freq: Once | INTRAMUSCULAR | Status: AC
  Administered 2016-04-28: 20 mL
  Filled 2016-04-28: qty 20

## 2016-04-28 MED ORDER — SODIUM CHLORIDE 0.9 % IV SOLN
3.0000 g | Freq: Once | INTRAVENOUS | Status: AC
  Administered 2016-04-28: 3 g via INTRAVENOUS
  Filled 2016-04-28: qty 3

## 2016-04-28 MED ORDER — IOPAMIDOL (ISOVUE-300) INJECTION 61%
100.0000 mL | Freq: Once | INTRAVENOUS | Status: AC | PRN
  Administered 2016-04-28: 80 mL via INTRAVENOUS

## 2016-04-28 NOTE — ED Triage Notes (Signed)
Right sided facial swelling that began x2 days ago.  Seen here x2 days ago for the same.  Patient states that swelling had appeared better yesterday but, this AM patient states that woke up and face was even more swollen and warm to touch.  Clear speech noted, no drooling.  Patient states that [facility]  Patient states that did begin abx that was given x2 days ago.

## 2016-04-28 NOTE — ED Provider Notes (Signed)
[facility]-EMERGENCY DEPT Provider Note   CSN: [id number] Arrival date & time: 04/28/16  1508     History   Chief Complaint Chief Complaint  Patient presents with  . Facial Swelling    HPI Kristin Hickman is a 46 y.o. female.  HPI Patient seen in emergency department 2 days care for dental infection.  Started on antibiotics.  Now reports worsening pain and worsening facial swelling.  She has not seen dentistry.  Pain is moderate to severe in severity.  No difficulty breathing or swallowing.  No other complaints.  Family has noticed increasing right-sided facial swelling.   Past Medical History:  Diagnosis Date  . Addison disease (HCC)   . Hypertension     There are no active problems to display for this patient.   Past Surgical History:  Procedure Laterality Date  . BACK SURGERY      OB History    No data available       Home Medications    Prior to Admission medications   Medication Sig Start Date End Date Taking? Authorizing Provider  acetaminophen (TYLENOL) 500 MG tablet Take 500 mg by mouth daily as needed for mild pain or headache.   Yes Historical Provider, MD  amoxicillin (AMOXIL) 500 MG capsule Take 2 capsules (1,000 mg total) by mouth 2 (two) times daily. 04/27/16  Yes Kristin Pisciotta, PA-C  ibuprofen (ADVIL,MOTRIN) 200 MG tablet Take 400 mg by mouth every 6 (six) hours as needed for moderate pain.   Yes Historical Provider, MD  oxyCODONE-acetaminophen (PERCOCET) 5-325 MG tablet Take 1-2 tablets by mouth every 4 (four) hours as needed. 04/27/16  Yes Kristin Pisciotta, PA-C  metroNIDAZOLE (FLAGYL) 500 MG tablet Take 1 tablet (500 mg total) by mouth 2 (two) times daily. One po bid x 7 days 04/27/16   Wynetta Emery, PA-C    Family History No family history on file.  Social History Social History  Substance Use Topics  . Smoking status: Current Every Day Smoker    Packs/day: 0.50    Types: Cigarettes  . Smokeless tobacco: Never Used  . Alcohol use No      Allergies   Codeine   Review of Systems Review of Systems  All other systems reviewed and are negative.    Physical Exam Updated Vital Signs BP 171/90 (BP Location: Left Arm)   Pulse 71   Temp 98.4 F (36.9 C)   Resp 18   SpO2 99%   Physical Exam  Constitutional: She is oriented to person, place, and time. She appears well-developed and well-nourished.  HENT:  Head: Normocephalic.  Right-sided facial swelling.  No trismus or malocclusion.  Tolerating secretions.  Oral airway patent.  Neck is normal.  Tenderness of the tooth #7.  Patient with severe dental decay throughout  Eyes: EOM are normal.  Neck: Normal range of motion.  Pulmonary/Chest: Effort normal.  Abdominal: She exhibits no distension.  Musculoskeletal: Normal range of motion.  Neurological: She is alert and oriented to person, place, and time.  Psychiatric: She has a normal mood and affect.  Nursing note and vitals reviewed.    ED Treatments / Results  Labs (all labs ordered are listed, but only abnormal results are displayed) Labs Reviewed  CBC - Abnormal; Notable for the following:       Result Value   WBC 12.0 (*)    Hemoglobin 11.6 (*)    All other components within normal limits  BASIC METABOLIC PANEL - Abnormal; Notable for the following:  Potassium 3.4 (*)    All other components within normal limits    EKG  EKG Interpretation None       Radiology Ct Soft Tissue Neck W Contrast  Result Date: 04/28/2016 CLINICAL DATA:  Right facial swelling 2 days. Upper molar dental infection EXAM: CT NECK WITH CONTRAST TECHNIQUE: Multidetector CT imaging of the neck was performed using the standard protocol following the bolus administration of intravenous contrast. CONTRAST:  80mL ISOVUE-300 IOPAMIDOL (ISOVUE-300) INJECTION 61% COMPARISON:  None. FINDINGS: Pharynx and larynx: Normal. No mass or swelling. Salivary glands: Surgical removal of the right submandibular gland with scarring in the  area. Left submandibular gland normal. Parotid gland normal bilaterally. Thyroid: Negative Lymph nodes: No pathologic nodes. Reactive nodes in the neck bilaterally. Right level 2 node 7 mm. Posterior node on the right 7 mm. Multiple 5 mm posterior nodes on the right. Left level 2 lymph node 9 mm, 7 mm. Small posterior nodes on the left. Vascular: Carotid artery and jugular vein patent bilaterally. Limited intracranial: Negative Visualized orbits: Negative Mastoids and visualized paranasal sinuses: Negative Skeleton: Periapical lucency around the right upper bicuspid. There is erosion of the lateral cortex of the maxilla. There is a subperiosteal rim enhancing fluid collection adjacent to the tooth abscess. The Soft tissue abscess measures 6 x 13 mm. No significant spinal abnormality. Overall poor dentition with multiple caries. Upper chest: Negative Other: None IMPRESSION: Periapical abscess around right upper bicuspid tooth. There is a 6 x 13 mm soft tissue abscess anterior to this dental abscess. Overall poor dentition with multiple caries. Negative for mass or adenopathy in the neck. Electronically Signed   By: Marlan Palauharles  Clark M.D.   On: 04/28/2016 19:19    Procedures .Marland Kitchen.Incision and Drainage Performed by: Azalia BilisAMPOS, Kaylaann Mountz Authorized by: Azalia BilisAMPOS, Asal Teas    Consent: Verbal consent obtained. Risks and benefits: risks, benefits and alternatives were discussed Time out performed prior to procedure Type: abscess Body area: Tooth number 7 Anesthesia: local infiltration Incision was made with a scalpel. Local anesthetic: lidocaine 2% with epinephrine Anesthetic total: 4 ml Complexity: complex Blunt dissection to break up loculations Drainage: purulent Drainage amount: moderate Packing material: none Patient tolerance: Patient tolerated the procedure well with no immediate complications.     Medications Ordered in ED Medications  Ampicillin-Sulbactam (UNASYN) 3 g in sodium chloride 0.9 % 100 mL  IVPB (0 g Intravenous Stopped 04/28/16 1824)  HYDROmorphone (DILAUDID) injection 1 mg (1 mg Intravenous Given 04/28/16 1704)  sodium chloride 0.9 % bolus 1,000 mL (0 mLs Intravenous Stopped 04/28/16 1824)  HYDROmorphone (DILAUDID) injection 1 mg (1 mg Intravenous Given 04/28/16 1823)  iopamidol (ISOVUE-300) 61 % injection 100 mL (80 mLs Intravenous Contrast Given 04/28/16 1848)  lidocaine-EPINEPHrine (XYLOCAINE W/EPI) 2 %-1:200000 (PF) injection 20 mL (20 mLs Infiltration Given by Other 04/28/16 1958)     Initial Impression / Assessment and Plan / ED Course  I have reviewed the triage vital signs and the nursing notes.  Pertinent labs & imaging results that were available during my care of the patient were reviewed by me and considered in my medical decision making (see chart for details).     Incision and drainage of right periapical abscess.  Moderate purulent material obtained after incision and drainage at the bedside.  Patient feels better.  Discharge home in good condition.     Final Clinical Impressions(s) / ED Diagnoses   Final diagnoses:  Periapical abscess with facial involvement    New Prescriptions Current Discharge Medication List  Azalia Bilis, MD 04/30/16 [phone]

## 2016-04-28 NOTE — ED Notes (Signed)
Patient transported to CT 

## 2016-04-28 NOTE — ED Notes (Signed)
Patient denies numbness, tingling in extremeties, denies chest pain or SOB.

## 2017-06-12 ENCOUNTER — Emergency Department (HOSPITAL_COMMUNITY)
Admission: EM | Admit: 2017-06-12 | Discharge: 2017-06-12 | Disposition: A | Discharge status: Home / Self Care | Payer: Self-pay | Attending: Emergency Medicine | Admitting: Emergency Medicine

## 2017-06-12 ENCOUNTER — Other Ambulatory Visit: Payer: Self-pay

## 2017-06-12 ENCOUNTER — Emergency Department (HOSPITAL_COMMUNITY): Payer: Self-pay

## 2017-06-12 ENCOUNTER — Encounter (HOSPITAL_COMMUNITY): Payer: Self-pay

## 2017-06-12 DIAGNOSIS — M25561 Pain in right knee: Secondary | ICD-10-CM | POA: Insufficient documentation

## 2017-06-12 DIAGNOSIS — Z79899 Other long term (current) drug therapy: Secondary | ICD-10-CM | POA: Insufficient documentation

## 2017-06-12 DIAGNOSIS — F17210 Nicotine dependence, cigarettes, uncomplicated: Secondary | ICD-10-CM | POA: Insufficient documentation

## 2017-06-12 DIAGNOSIS — I10 Essential (primary) hypertension: Secondary | ICD-10-CM | POA: Insufficient documentation

## 2017-06-12 MED ORDER — MELOXICAM 15 MG PO TABS: 15.0000 mg | ORAL_TABLET | Freq: Every day | ORAL | 0 refills | Status: DC

## 2017-06-12 MED ORDER — ACETAMINOPHEN 500 MG PO TABS
1000.0000 mg | ORAL_TABLET | Freq: Once | ORAL | Status: AC
  Administered 2017-06-12: 1000 mg via ORAL
  Filled 2017-06-12: qty 2

## 2017-06-12 NOTE — ED Provider Notes (Signed)
[facility]-EMERGENCY DEPT Provider Note   CSN: [id number] Arrival date & time: 06/12/17  1701     History   Chief Complaint Chief Complaint  Patient presents with  . Knee Pain    HPI Kristin Hickman is a 47 y.o. female with history of hypertension who presents emergency department today for right knee pain times 1 week.  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  She reports that she has been climbing ladders, squatting to decorate, as well as walking more for the last 2 weeks.  She reports that one week ago she awoke with swelling of the right knee.  She reports that anytime she goes up stairs or squats down she hears a "clicking sound" in her knee.  She feels like the knee clicks and pops on occasion.  She reports that times when she is walking she feels like the knee gets stuck.  The knee has never given way on her.  She denies a dislocation/relocation injury.  She denies any kneeling/pressure on her knee that would bring concern for a prepatellar bursitis.  The patient reports she is been using ice vegetables, Aleve and icy hot for her symptoms without relief.  She denies any fever, trauma, joint erythema, numbness/tingling/weakness of the extremity.  HPI  Past Medical History:  Diagnosis Date  . Addison disease (HCC)   . Hypertension     There are no active problems to display for this patient.   Past Surgical History:  Procedure Laterality Date  . BACK SURGERY       OB History   None      Home Medications    Prior to Admission medications   Medication Sig Start Date End Date Taking? Authorizing Provider  acetaminophen (TYLENOL) 500 MG tablet Take 500 mg by mouth daily as needed for mild pain or headache.    [provider]  amoxicillin (AMOXIL) 500 MG capsule Take 2 capsules (1,000 mg total) by mouth 2 (two) times daily. 04/27/16   Pisciotta, Joni Reining, PA-C  ibuprofen (ADVIL,MOTRIN) 200  MG tablet Take 400 mg by mouth every 6 (six) hours as needed for moderate pain.    [provider]  metroNIDAZOLE (FLAGYL) 500 MG tablet Take 1 tablet (500 mg total) by mouth 2 (two) times daily. One po bid x 7 days 04/27/16   Pisciotta, Joni Reining, PA-C  oxyCODONE-acetaminophen (PERCOCET) 5-325 MG tablet Take 1-2 tablets by mouth every 4 (four) hours as needed. 04/27/16   Pisciotta, Joni Reining, PA-C  promethazine (PHENERGAN) 25 MG tablet Take 1 tablet (25 mg total) by mouth every 6 (six) hours as needed for nausea or vomiting. Patient not taking: Reported on 06/28/2014 11/05/13 06/28/14  Emilia Beck, PA-C  XARELTO STARTER PACK 15 & 20 MG TBPK Take 15-20 mg by mouth as directed. Take as directed on package: Start with one 15mg  tablet by mouth twice a day with food. On Day 22, switch to one 20mg  tablet once a day with food. 12/03/14 07/22/15  Dowless, Lester Kinsman, PA-C    Family History History reviewed. No pertinent family history.  Social History Social History   Tobacco Use  . Smoking status: Current Every Day Smoker    Packs/day: 0.50    Types: Cigarettes  . Smokeless tobacco: Never Used  Substance Use Topics  . Alcohol use: No  . Drug use: No     Allergies   Codeine   Review of Systems Review  of Systems  All other systems reviewed and are negative.    Physical Exam Updated Vital Signs BP (!) 123/95 (BP Location: Right Arm)   Pulse 96   Temp 98.2 F (36.8 C) (Oral)   Resp 18   Ht 5\' 7"  (1.702 m)   Wt 79.4 kg (175 lb)   SpO2 98%   BMI 27.41 kg/m   Physical Exam  Constitutional: She appears well-developed and well-nourished.  HENT:  Head: Normocephalic and atraumatic.  Right Ear: External ear normal.  Left Ear: External ear normal.  Eyes: Conjunctivae are normal. Right eye exhibits no discharge. Left eye exhibits no discharge. No scleral icterus.  Cardiovascular:  Pulses:      Dorsalis pedis pulses are 2+ on the right side, and 2+ on the left side.        Posterior tibial pulses are 2+ on the right side, and 2+ on the left side.  No edema of the lower legs.   Pulmonary/Chest: Effort normal. No respiratory distress.  Musculoskeletal:  Appearance normal.  Noted effusion.  Positive ballottement test.  No prepatellar bursa swelling.  No obvious deformity. No erythema, heat, fluctuance or break of the skin. TTP over medial and lateral joint line. Active and passive flexion and extension mildly limited to 90 and 175 degrees with noted crepitus. Patient reports pain with maximal flexion and extension. Negative anterior/poster drawer bilaterally. Positive medial McMurray's test. No varus or valgus laxity or locking. No TTP of hips or ankles. Compartments soft. Neurovascularly intact distally to site of injury.  Neurological: She is alert.  Skin: Skin is warm and dry. Capillary refill takes less than 2 seconds. No rash noted. No erythema. No pallor.  Psychiatric: She has a normal mood and affect.  Nursing note and vitals reviewed.    ED Treatments / Results  Labs (all labs ordered are listed, but only abnormal results are displayed) Labs Reviewed - No data to display  EKG None  Radiology Dg Knee Complete 4 Views Right  Result Date: 06/12/2017 CLINICAL DATA:  Right knee pain EXAM: RIGHT KNEE - COMPLETE 4+ VIEW COMPARISON:  08/26/2014 FINDINGS: No fracture or malalignment. Moderate knee effusion. Joint spaces are maintained IMPRESSION: No acute osseous abnormality.  Moderate knee effusion Electronically Signed   By: Jasmine Pang M.D.   On: 06/12/2017 18:03    Procedures Procedures (including critical care time)  Medications Ordered in ED Medications  acetaminophen (TYLENOL) tablet 1,000 mg (1,000 mg Oral Given 06/12/17 1758)     Initial Impression / Assessment and Plan / ED Course  I have reviewed the triage vital signs and the nursing notes.  Pertinent labs & imaging results that were available during my care of the patient were reviewed  by me and considered in my medical decision making (see chart for details).     47 y.o. female with right knee pain with noted joint effusion.  She reports tightness of the knee, clicking and popping.  Knee never gives way on her.  She denies any known trauma but has been walking, squatting and bending more recently 2/2 work.  History and physical exam is not consistent with prepatellar bursitis.  Patient does have positive McMurray's test concerning for a meniscus injury.  X-ray with noted effusion.  Pt with mild swelling to the joint spaces, knee swelling, tightness in the knee, and mildly restricted range of motion. Pt unable to perform full flexion of the knee.  Pt is without systemic symptoms, erythema or redness of the joint  consistent with gout or septic joint.  Patient X-Ray negative for obvious fracture or dislocation. Pain managed in ED. Pt advised to follow up with orthopedics if symptoms persist for further evaluation and treatment. Patient given brace while in ED, conservative therapy recommended and discussed.  Will give prescription for Mobic.  Patient's most recent creatinine within normal limits.  No history of GI bleed or ulcers in the past.  Strict return precautions discussed.  Patient will be dc home & is agreeable with above plan.  Final Clinical Impressions(s) / ED Diagnoses   Final diagnoses:  Acute pain of right knee    ED Discharge Orders    None       Princella PellegriniMaczis, Barett Whidbee M, PA-C 06/12/17 1854    Linwood DibblesKnapp, Jon, MD 06/12/17 [id number]

## 2017-06-12 NOTE — ED Triage Notes (Signed)
Patient presents with right knee pain and swelling, without injury or trauma. Patient reports "a clicking sound" when she walks. Patient reports she previously had her knee swell up and had to "have fluid drained off my knee." Patient ambulated to triage without assistance. Patient reports taking Aleve and BC powder for pain.

## 2017-06-12 NOTE — Discharge Instructions (Signed)
Your x-ray was negative for fracture or dislocation. Your exam is concerning for a possible injury to your meniscus.  You  would need further evaluation by a specialist to determine this. Please follow attached handout. Please wear a brace and use crutches as needed for comfort. Return for fever, redness over your joint or inability to move the joint.  If you develop worsening or new concerning symptoms you can return to the emergency department for re-evaluation.

## 2021-05-11 ENCOUNTER — Encounter (HOSPITAL_BASED_OUTPATIENT_CLINIC_OR_DEPARTMENT_OTHER): Payer: Self-pay

## 2021-05-11 ENCOUNTER — Emergency Department (HOSPITAL_BASED_OUTPATIENT_CLINIC_OR_DEPARTMENT_OTHER)
Admission: EM | Admit: 2021-05-11 | Discharge: 2021-05-11 | Disposition: A | Discharge status: Home / Self Care | Payer: Self-pay | Attending: Emergency Medicine | Admitting: Emergency Medicine

## 2021-05-11 ENCOUNTER — Other Ambulatory Visit: Payer: Self-pay

## 2021-05-11 ENCOUNTER — Encounter: Payer: Self-pay | Admitting: Emergency Medicine

## 2021-05-11 ENCOUNTER — Ambulatory Visit
Admission: EM | Admit: 2021-05-11 | Discharge: 2021-05-11 | Disposition: A | Discharge status: Home / Self Care | Payer: Self-pay

## 2021-05-11 DIAGNOSIS — R6884 Jaw pain: Secondary | ICD-10-CM | POA: Insufficient documentation

## 2021-05-11 DIAGNOSIS — L03114 Cellulitis of left upper limb: Secondary | ICD-10-CM | POA: Insufficient documentation

## 2021-05-11 DIAGNOSIS — L089 Local infection of the skin and subcutaneous tissue, unspecified: Secondary | ICD-10-CM

## 2021-05-11 DIAGNOSIS — T148XXA Other injury of unspecified body region, initial encounter: Secondary | ICD-10-CM

## 2021-05-11 DIAGNOSIS — F17200 Nicotine dependence, unspecified, uncomplicated: Secondary | ICD-10-CM | POA: Insufficient documentation

## 2021-05-11 DIAGNOSIS — Z23 Encounter for immunization: Secondary | ICD-10-CM | POA: Insufficient documentation

## 2021-05-11 DIAGNOSIS — E876 Hypokalemia: Secondary | ICD-10-CM | POA: Insufficient documentation

## 2021-05-11 LAB — CBC WITH DIFFERENTIAL/PLATELET
Abs Immature Granulocytes: 0.01 10*3/uL (ref 0.00–0.07)
Basophils Absolute: 0 10*3/uL (ref 0.0–0.1)
Basophils Relative: 1 %
Eosinophils Absolute: 0.1 10*3/uL (ref 0.0–0.5)
Eosinophils Relative: 1 %
HCT: 39.1 % (ref 36.0–46.0)
Hemoglobin: 12.6 g/dL (ref 12.0–15.0)
Immature Granulocytes: 0 %
Lymphocytes Relative: 31 %
Lymphs Abs: 1.8 10*3/uL (ref 0.7–4.0)
MCH: 31.3 pg (ref 26.0–34.0)
MCHC: 32.2 g/dL (ref 30.0–36.0)
MCV: 97 fL (ref 80.0–100.0)
Monocytes Absolute: 0.4 10*3/uL (ref 0.1–1.0)
Monocytes Relative: 6 %
Neutro Abs: 3.5 10*3/uL (ref 1.7–7.7)
Neutrophils Relative %: 61 %
Platelets: 210 10*3/uL (ref 150–400)
RBC: 4.03 MIL/uL (ref 3.87–5.11)
RDW: 14.6 % (ref 11.5–15.5)
WBC: 5.7 10*3/uL (ref 4.0–10.5)
nRBC: 0 % (ref 0.0–0.2)

## 2021-05-11 LAB — COMPREHENSIVE METABOLIC PANEL
ALT: 14 U/L (ref 0–44)
AST: 22 U/L (ref 15–41)
Albumin: 3.9 g/dL (ref 3.5–5.0)
Alkaline Phosphatase: 84 U/L (ref 38–126)
Anion gap: 8 (ref 5–15)
BUN: <5 mg/dL — ABNORMAL LOW (ref 6–20)
CO2: 29 mmol/L (ref 22–32)
Calcium: 9.2 mg/dL (ref 8.9–10.3)
Chloride: 104 mmol/L (ref 98–111)
Creatinine, Ser: 0.79 mg/dL (ref 0.44–1.00)
GFR, Estimated: >60 mL/min (ref >60)
Glucose, Bld: 96 mg/dL (ref 70–99)
Potassium: 3.4 mmol/L — ABNORMAL LOW (ref 3.5–5.1)
Sodium: 141 mmol/L (ref 135–145)
Total Bilirubin: 0.6 mg/dL (ref 0.3–1.2)
Total Protein: 6.8 g/dL (ref 6.5–8.1)

## 2021-05-11 LAB — URINALYSIS, ROUTINE W REFLEX MICROSCOPIC
Bilirubin Urine: NEGATIVE
Glucose, UA: NEGATIVE mg/dL
Hgb urine dipstick: NEGATIVE
Ketones, ur: NEGATIVE mg/dL
Leukocytes,Ua: NEGATIVE
Nitrite: NEGATIVE
Protein, ur: NEGATIVE mg/dL
Specific Gravity, Urine: <1.005 — ABNORMAL LOW (ref 1.005–1.030)
pH: 7 (ref 5.0–8.0)

## 2021-05-11 LAB — PREGNANCY, URINE: Preg Test, Ur: NEGATIVE

## 2021-05-11 MED ORDER — DOXYCYCLINE HYCLATE 100 MG PO CAPS: 100.0000 mg | ORAL_CAPSULE | Freq: Two times a day (BID) | ORAL | 0 refills | Status: DC

## 2021-05-11 MED ORDER — TETANUS-DIPHTH-ACELL PERTUSSIS 5-2.5-18.5 LF-MCG/0.5 IM SUSY
0.5000 mL | PREFILLED_SYRINGE | Freq: Once | INTRAMUSCULAR | Status: AC
  Administered 2021-05-11: 20:00:00 0.5 mL via INTRAMUSCULAR
  Filled 2021-05-11: qty 0.5

## 2021-05-11 MED ORDER — DOXYCYCLINE HYCLATE 100 MG PO TABS
100.0000 mg | ORAL_TABLET | Freq: Once | ORAL | Status: AC
  Administered 2021-05-11: 20:00:00 100 mg via ORAL
  Filled 2021-05-11: qty 1

## 2021-05-11 NOTE — ED Provider Notes (Signed)
Patient presents today for concerns for wound infection. She reports she sustained a cut to left forearm by old nail 10 days ago while remodeling house. Wound has grown in size and satellite wounds are now developing distally. She also reports some tightness in her jaw that has started recently. She is not up to date with tetanus vaccination. Recommended further evaluation in the ED. Patient is agreeable to same.  ?  ?Tomi Bamberger, PA-C ?05/11/21 1533 ? ?

## 2021-05-11 NOTE — Discharge Instructions (Signed)
  Please report to [facility] for further evaluation.  

## 2021-05-11 NOTE — ED Triage Notes (Signed)
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  Pt has a wound to left forearm and other blisters are forming.  Was seen at urgent care and sent straight here.  States her jaw is tightening on both sides and c/o headache. ?Last tetanus was 15 yrs ago ?

## 2021-05-11 NOTE — Discharge Instructions (Addendum)
You were seen here today for evaluation of your left arm wound. We have taken a blood culture and someone will call you if it is positive. We have updated your tetanus while you were here too. I am sending you home on doxycycline for you to take twice daily for the next 7 days. If the wound gets bigger or more painful, please return to the ER for re-evaluation.  ? ?Additionally, I have included information on a primary care provider. Please call to schedule an appointment.  ? ?Contact a doctor if: ?You have a fever. ?You do not start to get better after 1-2 days of treatment. ?Your bone or joint under the infected area starts to hurt after the skin has healed. ?Your infection comes back. This can happen in the same area or another area. ?You have a swollen bump in the area. ?You have new symptoms. ?You feel ill and have muscle aches and pains. ?Get help right away if: ?Your symptoms get worse. ?You feel very sleepy. ?You throw up (vomit) or have watery poop (diarrhea) for a long time. ?You see red streaks coming from the area. ?Your red area gets larger. ?Your red area turns dark in color. ?

## 2021-05-11 NOTE — ED Provider Notes (Signed)
[facility] EMERGENCY DEPT Provider Note   CSN: [id number] Arrival date & time: 05/11/21  1739     History No chief complaint on file.   Kristin Hickman is a 51 y.o. female without primary care follow-up presents the emergency department for evaluation of left forearm wound has been present for the past 10 to 14 days.  She was sent over here from urgent care given the appearance of the wound and the presence of jaw pain without an updated tetanus.  She denies any trouble swallowing or eating.  Denies any chest pain, shortness of breath, fever, numbness, tingling, muscle spasms.  She reports that she initially noticed the wound on her arm because they are remodeling a house.  She reports she was moving around lumber and could have scraped it on a nail but does not remember.  She reports she is in clean the wound daily with Neosporin and peroxide but the wound is not improving.  She reports she also noted a mark on the back of her hand but does not know when exactly it showed up.  She denies any medical history although she has not seen a PCP in years.  Allergic to codeine.  Pack per day smoker.  Daily drinker.  Denies any IV drug use ever.  HPI     Home Medications Prior to Admission medications   Medication Sig Start Date End Date Taking? Authorizing Provider  acetaminophen (TYLENOL) 500 MG tablet Take 500 mg by mouth daily as needed for mild pain or headache.    [provider]  amoxicillin (AMOXIL) 500 MG capsule Take 2 capsules (1,000 mg total) by mouth 2 (two) times daily. 04/27/16   Pisciotta, Joni ReiningNicole, PA-C  ibuprofen (ADVIL,MOTRIN) 200 MG tablet Take 400 mg by mouth every 6 (six) hours as needed for moderate pain.    [provider]  meloxicam (MOBIC) 15 MG tablet Take 1 tablet (15 mg total) by mouth daily. 06/12/17   Maczis, Elmer SowMichael M, PA-C  metroNIDAZOLE (FLAGYL) 500 MG tablet Take 1 tablet (500 mg total) by mouth 2 (two) times daily. One po bid x 7 days  04/27/16   Pisciotta, Joni ReiningNicole, PA-C  oxyCODONE-acetaminophen (PERCOCET) 5-325 MG tablet Take 1-2 tablets by mouth every 4 (four) hours as needed. 04/27/16   Pisciotta, Joni ReiningNicole, PA-C      Allergies    Codeine    Review of Systems   Review of Systems  Constitutional:  Negative for chills and fever.  HENT:  Positive for dental problem.   Musculoskeletal:  Negative for myalgias.  Skin:  Positive for wound.   See HPI  Physical Exam Updated Vital Signs BP (!) 145/103    Pulse 76    Temp 97.6 F (36.4 C)    Resp 18    Ht 5\' 6"  (1.676 m)    Wt 66 kg    SpO2 99%    BMI 23.48 kg/m  Physical Exam Vitals and nursing note reviewed.  Constitutional:      General: She is not in acute distress.    Appearance: Normal appearance. She is not toxic-appearing.  HENT:     Head: Normocephalic and atraumatic.     Mouth/Throat:     Comments: Extremely poor dentition.  No trismus noted.  No dislocation of the TMJ.  Mastoid muscle intact.  No palpable dental abscess. Eyes:     General: No scleral icterus. Cardiovascular:     Rate and Rhythm: Normal rate and regular rhythm.  Pulmonary:  Effort: Pulmonary effort is normal.     Breath sounds: Normal breath sounds.  Abdominal:     General: Abdomen is flat. Bowel sounds are normal.     Palpations: Abdomen is soft.  Musculoskeletal:        General: No deformity.     Cervical back: Normal range of motion.  Skin:    General: Skin is warm and dry.     Comments: Approximately 3 cm area of erythema with central wound that has no fluctuance on the left dorsal aspect of the forearm.  There is no red streaking.  No overlying warmth.  No crepitus.  There is a small pustule seen on the dorsum of the left hand patient still is full range of motion of her wrist and elbow.  Neurological:     General: No focal deficit present.     Mental Status: She is alert. Mental status is at baseline.         ED Results / Procedures / Treatments   Labs (all labs  ordered are listed, but only abnormal results are displayed) Labs Reviewed  COMPREHENSIVE METABOLIC PANEL - Abnormal; Notable for the following components:      Result Value   Potassium 3.4 (*)    BUN <5 (*)    All other components within normal limits  URINALYSIS, ROUTINE W REFLEX MICROSCOPIC - Abnormal; Notable for the following components:   Color, Urine COLORLESS (*)    Specific Gravity, Urine <1.005 (*)    All other components within normal limits  CULTURE, BLOOD (SINGLE)  CBC WITH DIFFERENTIAL/PLATELET  PREGNANCY, URINE    EKG None  Radiology No results found.  Procedures Procedures   Medications Ordered in ED Medications  Tdap (BOOSTRIX) injection 0.5 mL (0.5 mLs Intramuscular Given 05/11/21 1948)  doxycycline (VIBRA-TABS) tablet 100 mg (100 mg Oral Given 05/11/21 1948)    ED Course/ Medical Decision Making/ A&P Clinical Course as of 05/13/21 0028  Thu May 11, 2021  1943 This is a 51 year old female presenting to ED with an injury to her left forearm while cleaning a shed approximately a week and a half ago.  She think she may have scraped her forearm but is unclear with what.  She reports that she has noted a raised red lesion on her mid left forearm that she has been cleaning with peroxide daily.  She says she has had some tenderness of her left extensor muscles, no streaking redness up her arm.  She noted a smaller lesion on her finger earlier today.  She denies fevers or chills.  Clinical exam she is afebrile, she does have a mildly ulcerated lesion approximately 3 cm in circular circumference on her extensor tendon of the left forearm.  No tracking tenderness or crepitus of the forearm.  No weakness of the arm muscles.  She has a small pockmark lesion on the dorsum of her left hand.  Overall this to be consistent with a staph infection, MRSA.  She is afebrile with no leukocytosis, and I have a lower suspicion for sepsis.  We can update her tetanus here; she is very poor  dentition and reports tooth pain, but I do not appreciate true trismus on exam, she does not have clonus of the extremities suggest tetanus.  We will start her on doxycycline for 10 days.  We will send a single blood culture, given that she appears to have a smaller possible satellite lesion on her left hand, to ensure she is not seeding infection  in the blood. I made the patient aware that if she receives a phone call for positive results she would need to return for IV antibiotics. [MT]    Clinical Course User Index [MT] Trifan, Kermit Balo, MD                           Medical Decision Making Amount and/or Complexity of Data Reviewed Labs: ordered.  Risk Prescription drug management.  VONDELL SOWELL is a 51 y.o. female without primary care follow-up presents the emergency department for evaluation of left forearm wound has been present for the past 10 to 14 days.  Differential diagnosis includes was not limited to MRSA, cellulitis, tetanus, TMJ.  Vital signs show slightly elevated blood pressure 130/91.  Patient is afebrile, normal heart rate, satting well on room air without any increased work of breathing.  Physical exam is pertinent for Approximately 3 cm area of erythema with central wound that has no fluctuance on the left dorsal aspect of the forearm.  There is no red streaking.  No overlying warmth.  No crepitus.  There is a small pustule seen on the dorsum of the left hand patient still is full range of motion of her wrist and elbow. See pictures for more detail. The patient has extremely poor dentition.  I do not note any trismus or palpable abscess.  Patient does not have any tenderness over TMJ and masseter muscles intact.  I independently reviewed and interpreted the patient's labs.  Pregnancy test negative.  CBC shows no leukocytosis or anemia.  Urinalysis normal.  CMP shows slightly decreased potassium 3.4.  BUN less than 5.  Normal LFTs.  No other electrolyte abnormality.  Single blood  culture obtained given the second pustules on the back of the hand.  After discussion with my attending, doubt any tetanus at this time.  Will send home on doxycycline.  First dose of doxycycline and Tetanus booster given in the ER.  Return precautions were discussed.  Patient agrees to plan.  Patient is stable be discharged home in good condition.  I discussed this case with my attending physician who cosigned this note including patient's presenting symptoms, physical exam, and planned diagnostics and interventions. Attending physician stated agreement with plan or made changes to plan which were implemented.   Attending physician assessed patient at bedside.  Final Clinical Impression(s) / ED Diagnoses Final diagnoses:  Cellulitis of left upper extremity    Rx / DC Orders ED Discharge Orders          Ordered    doxycycline (VIBRAMYCIN) 100 MG capsule  2 times daily        05/11/21 2113              Achille Rich, PA-C 05/13/21 0040    Terald Sleeper, MD 05/13/21 1218

## 2021-05-11 NOTE — ED Triage Notes (Addendum)
Remodeling house 10 days prior, thought she may have gotten cut by an old nail on the back of her right arm. Then the wound started getting worse, she believed it may  have been a spider bite. Wound continued to get worse since then. Started developing new spots down her left arm. Forearm swollen, painful, tender to touch. Wound bed is large, ulcerated, yellow drainage. Also complaining of a very tight jaw that is causing a headache. Has not had a tetanus shot in over 15 years ?

## 2021-05-17 LAB — CULTURE, BLOOD (SINGLE)
Culture: NO GROWTH
Special Requests: ADEQUATE

## 2021-06-05 ENCOUNTER — Other Ambulatory Visit: Payer: Self-pay

## 2021-06-05 ENCOUNTER — Emergency Department (HOSPITAL_COMMUNITY): Payer: Medicaid Other

## 2021-06-05 ENCOUNTER — Encounter (HOSPITAL_COMMUNITY): Payer: Self-pay

## 2021-06-05 ENCOUNTER — Emergency Department (HOSPITAL_COMMUNITY)
Admission: EM | Admit: 2021-06-05 | Discharge: 2021-06-05 | Disposition: A | Discharge status: Home / Self Care | Payer: Medicaid Other | Attending: Emergency Medicine | Admitting: Emergency Medicine

## 2021-06-05 DIAGNOSIS — S61402A Unspecified open wound of left hand, initial encounter: Secondary | ICD-10-CM | POA: Insufficient documentation

## 2021-06-05 DIAGNOSIS — S51802A Unspecified open wound of left forearm, initial encounter: Secondary | ICD-10-CM | POA: Insufficient documentation

## 2021-06-05 DIAGNOSIS — F17200 Nicotine dependence, unspecified, uncomplicated: Secondary | ICD-10-CM | POA: Insufficient documentation

## 2021-06-05 DIAGNOSIS — L089 Local infection of the skin and subcutaneous tissue, unspecified: Secondary | ICD-10-CM

## 2021-06-05 DIAGNOSIS — X58XXXA Exposure to other specified factors, initial encounter: Secondary | ICD-10-CM | POA: Insufficient documentation

## 2021-06-05 LAB — CBC WITH DIFFERENTIAL/PLATELET
Abs Immature Granulocytes: 0.05 10*3/uL (ref 0.00–0.07)
Basophils Absolute: 0 10*3/uL (ref 0.0–0.1)
Basophils Relative: 1 %
Eosinophils Absolute: 0 10*3/uL (ref 0.0–0.5)
Eosinophils Relative: 1 %
HCT: 40.5 % (ref 36.0–46.0)
Hemoglobin: 13.1 g/dL (ref 12.0–15.0)
Immature Granulocytes: 1 %
Lymphocytes Relative: 23 %
Lymphs Abs: 1.3 10*3/uL (ref 0.7–4.0)
MCH: 31.4 pg (ref 26.0–34.0)
MCHC: 32.3 g/dL (ref 30.0–36.0)
MCV: 97.1 fL (ref 80.0–100.0)
Monocytes Absolute: 0.3 10*3/uL (ref 0.1–1.0)
Monocytes Relative: 6 %
Neutro Abs: 4 10*3/uL (ref 1.7–7.7)
Neutrophils Relative %: 68 %
Platelets: 188 10*3/uL (ref 150–400)
RBC: 4.17 MIL/uL (ref 3.87–5.11)
RDW: 14.4 % (ref 11.5–15.5)
WBC: 5.8 10*3/uL (ref 4.0–10.5)
nRBC: 0 % (ref 0.0–0.2)

## 2021-06-05 LAB — COMPREHENSIVE METABOLIC PANEL
ALT: 16 U/L (ref 0–44)
AST: 22 U/L (ref 15–41)
Albumin: 3.9 g/dL (ref 3.5–5.0)
Alkaline Phosphatase: 84 U/L (ref 38–126)
Anion gap: 7 (ref 5–15)
BUN: <5 mg/dL — ABNORMAL LOW (ref 6–20)
CO2: 26 mmol/L (ref 22–32)
Calcium: 8.9 mg/dL (ref 8.9–10.3)
Chloride: 103 mmol/L (ref 98–111)
Creatinine, Ser: 0.78 mg/dL (ref 0.44–1.00)
GFR, Estimated: >60 mL/min (ref >60)
Glucose, Bld: 120 mg/dL — ABNORMAL HIGH (ref 70–99)
Potassium: 3.5 mmol/L (ref 3.5–5.1)
Sodium: 136 mmol/L (ref 135–145)
Total Bilirubin: 0.5 mg/dL (ref 0.3–1.2)
Total Protein: 6.9 g/dL (ref 6.5–8.1)

## 2021-06-05 LAB — LACTIC ACID, PLASMA: Lactic Acid, Venous: 1.1 mmol/L (ref 0.5–1.9)

## 2021-06-05 MED ORDER — SULFAMETHOXAZOLE-TRIMETHOPRIM 800-160 MG PO TABS: 1.0000 | ORAL_TABLET | Freq: Two times a day (BID) | ORAL | 0 refills | Status: AC

## 2021-06-05 MED ORDER — SULFAMETHOXAZOLE-TRIMETHOPRIM 800-160 MG PO TABS
1.0000 | ORAL_TABLET | Freq: Once | ORAL | Status: AC
  Administered 2021-06-05: 1 via ORAL
  Filled 2021-06-05: qty 1

## 2021-06-05 MED ORDER — ITRACONAZOLE 100 MG PO CAPS: ORAL_CAPSULE | ORAL | 0 refills | Status: AC

## 2021-06-05 MED ORDER — CEFTRIAXONE SODIUM 1 G IJ SOLR
1.0000 g | Freq: Once | INTRAMUSCULAR | Status: AC
  Administered 2021-06-05: 1 g via INTRAMUSCULAR
  Filled 2021-06-05: qty 10

## 2021-06-05 NOTE — ED Triage Notes (Signed)
Patient was wounded with a nail on 05/11/21 to the left forearm area Patient was given antibiotics for a staph infection. Patient states that she completed the antibiotics. ? ?Patient reports that she has burning and redness to that area still. Patient also reports that she has dog scratches to the hands and is now having raised areas and redness to the skin. ?

## 2021-06-05 NOTE — Discharge Instructions (Addendum)
Take antibiotics as prescribed. ?Take antifungal as prescribed. Your primary care doctor will need to monitor you and check blood work (liver function) while taking this medication.  ?Recheck with hand specialist. ?Return to the ER at anytime for worsening or concerning symptoms.  ?

## 2021-06-05 NOTE — ED Provider Triage Note (Signed)
Emergency Medicine Provider Triage Evaluation Note ? ?Kristin Hickman , a 51 y.o. female  was evaluated in triage.  Pt presents today for areas concerning for multiple skin infections.  Patient was seen on 3/9 forearm on the extensor surface and was treated for MRSA with doxycycline.  Initially she stated that she might of scratched it on a rusty nail however she states now that happens every time her dog scratches her arm.  She has 3 new sites of concern 1 on the right hand is 50 days old associated with significant erythema and pain.  The initial area is also progressively worsening since initial presentation.  Denies fever.  Does endorse drainage from one of the wounds. ? ?Review of Systems  ?Positive: As above ?Negative: As above ? ?Physical Exam  ?BP (!) 139/116 (BP Location: Right Arm)   Pulse 98   Temp 98.9 ?F (37.2 ?C) (Oral)   Resp 18   Ht 5\' 6"  (1.676 m)   Wt 62.6 kg   SpO2 100%   BMI 22.27 kg/m?  ?Gen:   Awake, no distress   ?Resp:  Normal effort  ?MSK:   Moves extremities without difficulty  ?Other:   ? ?Medical Decision Making  ?Medically screening exam initiated at 6:10 PM.  Appropriate orders placed.  Kristin Hickman was informed that the remainder of the evaluation will be completed by another provider, this initial triage assessment does not replace that evaluation, and the importance of remaining in the ED until their evaluation is complete. ? ? ?  ?Kristin Fetters, PA-C ?06/05/21 1812 ? ?

## 2021-06-05 NOTE — ED Provider Notes (Signed)
?[facility]-EMERGENCY DEPT ?Provider Note ? ? ?CSN: [id number] ?Arrival date & time: 06/05/21  1713 ? ?  ? ?History ? ?No chief complaint on file. ? ? ?Kristin Hickman is a 51 y.o. female. ? ?51 year old female presents with complaint of nonhealing wounds/new wounds to her hands and arms.  Patient states about a month ago she was doing some work in her yard and developed a wound on her left forearm.  She was not sure if this came from cutting her arm on a nail or not, came to the emergency room at that time and was started on doxycycline.  Wound was photographed at that time.  Patient had been cleaning her wound with peroxide, she stopped and has only been using antibiotic soap to her hands.  States she has a new puppy and suspects that the hands now have wounds from the puppy scratches which are infected.  She denies fevers, axillary lymphadenopathy.  Complains of pain and burning in the dorsum of her right hand with redness which is worse with movement of her third and fourth fingers.  Past medical history of Addison's disease and hypertension.  Is a daily smoker. ? ? ?  ? ?Home Medications ?Prior to Admission medications   ?Medication Sig Start Date End Date Taking? Authorizing Provider  ?itraconazole (SPORANOX) 100 MG capsule Take 2 capsules (200 mg total) by mouth 3 (three) times daily for 3 days, THEN 2 capsules (200 mg total) daily. 06/05/21 07/08/21 Yes Jeannie Fend, PA-C  ?sulfamethoxazole-trimethoprim (BACTRIM DS) 800-160 MG tablet Take 1 tablet by mouth 2 (two) times daily for 10 days. 06/05/21 06/15/21 Yes Jeannie Fend, PA-C  ?   ? ?Allergies    ?Codeine   ? ?Review of Systems   ?Review of Systems ?Negative except as per HPI ?Physical Exam ?Updated Vital Signs ?BP (!) 149/104   Pulse (!) 106   Temp 98.9 ?F (37.2 ?C) (Oral)   Resp 17   Ht 5\' 6"  (1.676 m)   Wt 62.6 kg   SpO2 99%   BMI 22.27 kg/m?  ?Physical Exam ?Vitals and nursing note reviewed.  ?Constitutional:   ?   General: She  is not in acute distress. ?   Appearance: She is well-developed. She is not diaphoretic.  ?HENT:  ?   Head: Normocephalic and atraumatic.  ?Cardiovascular:  ?   Pulses: Normal pulses.  ?Pulmonary:  ?   Effort: Pulmonary effort is normal.  ?Musculoskeletal:     ?   General: Swelling and tenderness present.  ?Lymphadenopathy:  ?   Upper Body:  ?   Right upper body: No axillary adenopathy.  ?   Left upper body: No axillary adenopathy.  ?Skin: ?   General: Skin is warm and dry.  ?   Findings: Erythema and lesion present.  ?   Comments: Dorsum of right hand mildly swollen compared to left with mild erythema.  There is a tender open wound/nodule to the dorsum of the right hand near the right third MCP.  Pain with active and passive range of motion of the right third and fourth MCPs, no pain with flexion extension at the wrist.  Fingers are not held in flexion.  Capillary refill present.  Also found to have a large nodule to the left forearm which is nontender, does have an opening through the center of it however there is no active drainage.  Lesion to the left forearm with mild surrounding erythema however nontender.  Also similar  nodule to dorsum of left hand and left third finger.  ?Neurological:  ?   Mental Status: She is alert and oriented to person, place, and time.  ?   Sensory: No sensory deficit.  ?   Motor: No weakness.  ?Psychiatric:     ?   Behavior: Behavior normal.  ? ? ? ? ? ?ED Results / Procedures / Treatments   ?Labs ?(all labs ordered are listed, but only abnormal results are displayed) ?Labs Reviewed  ?COMPREHENSIVE METABOLIC PANEL - Abnormal; Notable for the following components:  ?    Result Value  ? Glucose, Bld 120 (*)   ? BUN <5 (*)   ? All other components within normal limits  ?AEROBIC CULTURE W GRAM STAIN (SUPERFICIAL SPECIMEN)  ?CBC WITH DIFFERENTIAL/PLATELET  ?LACTIC ACID, PLASMA  ? ? ?EKG ?None ? ?Radiology ?DG Hand Complete Right ? ?Result Date: 06/05/2021 ?CLINICAL DATA:  Skin infection.  EXAM: RIGHT HAND - COMPLETE 3 VIEW COMPARISON:  None. FINDINGS: There is no evidence of fracture or dislocation. Mild degenerative changes of the thumb IP joint. Soft tissues are unremarkable. IMPRESSION: No acute osseous abnormality. Electronically Signed   By: Allegra LaiLeah  Strickland M.D.   On: 06/05/2021 18:43   ? ?Procedures ?Procedures  ? ? ?Medications Ordered in ED ?Medications  ?cefTRIAXone (ROCEPHIN) injection 1 g (1 g Intramuscular Given 06/05/21 2000)  ?sulfamethoxazole-trimethoprim (BACTRIM DS) 800-160 MG per tablet 1 tablet (1 tablet Oral Given 06/05/21 2002)  ? ? ?ED Course/ Medical Decision Making/ A&P ?  ?                        ?Medical Decision Making ?Risk ?Prescription drug management. ? ? ?This patient presents to the ED for concern of nodules to arms, infection of right hand, this involves an extensive number of treatment options, and is a complaint that carries with it a high risk of complications and morbidity.  The differential diagnosis includes cellulitis, tenosynovitis,lymphangitis, sporotrichosis, MRSA ? ? ?Co morbidities that complicate the patient evaluation ? ?Smoker ? ? ?Additional history obtained: ? ? ?External records from outside source obtained and reviewed including ER visit from 05/11/2021 with negative blood culture, imaging reviewed ? ? ?Lab Tests: ? ?I Ordered, and personally interpreted labs.  The pertinent results include: CBC, CMP, normal WBC, normal LFTs.  Lactic acid negative. ? ?Imaging Studies ordered: ? ?I ordered imaging studies including x-ray right hand ?I independently visualized and interpreted imaging which showed no acute abnormality ?I agree with the radiologist interpretation ? ?Consultations Obtained: ? ?I requested consultation with the transitions of care team, request assistance with obtaining medication and follow-up/needs to establish PCP care ? ? ?Problem List / ED Course / Critical interventions / Medication management ? ?51 year old female with wounds to  bilateral hands and forearm as above.  Seen in the ED on March 9, started on doxycycline which she was compliant with.  Patient has been washing with antibacterial soap.  Concern for cellulitis of the dorsum of the right hand, does not appear to have extensor tenosynovitis at this time however discussed risks for patient and reasons to return.  Referred to hand Ortho for follow-up recheck of her right hand infection.  Discussed with Dr. Adela LankFloyd, ER attending patient's unusual sensation with these generally nontender nonhealing lesions. Concern for possible Sporotrichosis.  Patient denies working in [place].  Question if her dog's claws may be infected with something in the dirt causing the infection.  Either way, her LFTs  are assessed and are normal, started on antifungal.  Discussed with patient importance of follow-up for monitoring and resolution. ?I ordered medication including rocephin and bactrim  for cellulitis  ?Reevaluation of the patient after these medicines showed that the patient  administered prior to discharge  ?I have reviewed the patients home medicines and have made adjustments as needed ? ? ?Social Determinants of Health: ? ?No PCP, no insurance ? ? ? ? ? ? ? ?Final Clinical Impression(s) / ED Diagnoses ?Final diagnoses:  ?Wound infection  ? ? ?Rx / DC Orders ?ED Discharge Orders   ? ?      Ordered  ?  sulfamethoxazole-trimethoprim (BACTRIM DS) 800-160 MG tablet  2 times daily       ? 06/05/21 1950  ?  itraconazole (SPORANOX) 100 MG capsule       ? 06/05/21 1950  ? ?  ?  ? ?  ? ? ?  ?Jeannie Fend, PA-C ?06/05/21 2013 ? ?  ?Melene Plan, DO ?06/05/21 2126 ? ?

## 2021-06-08 LAB — AEROBIC CULTURE W GRAM STAIN (SUPERFICIAL SPECIMEN)

## 2021-06-09 ENCOUNTER — Telehealth: Payer: Self-pay | Admitting: Emergency Medicine

## 2021-06-09 NOTE — Telephone Encounter (Signed)
Post ED Visit - Positive Culture Follow-up: Successful Patient Follow-Up ? ?Culture assessed and recommendations reviewed by: ? ?[]  , Pharm.D. ?[]  Enzo Bi, Pharm.D., BCPS AQ-ID ?[]  , Pharm.D., BCPS ?[]  Celedonio Miyamoto, Pharm.D., BCPS ?[]  [place], Garvin Fila.D., BCPS, AAHIVP ?[]  , Pharm.D., BCPS, AAHIVP ?[]  Georgina Pillion, PharmD, BCPS ?[]  , PharmD, BCPS ?[]  [place], PharmD, BCPS ?[x]  [address], PharmD ? ?Positive aerobic (wound) culture ? ?[]  Patient discharged without antimicrobial prescription and treatment is now indicated ?[]  Organism is resistant to prescribed ED discharge antimicrobial ?[]  Patient with positive blood cultures ? ?Changes discussed with ED provider: , PA ?New antibiotic prescription Cephalexin 500 mg TID for seven days ?Called to [facility] [address]) [phone] ? ?Contacted patient, date 06/09/2021, time 1350 ? ? ?Kristin Hickman ?06/09/2021, 1:51 PM ? ?  ?

## 2021-06-09 NOTE — Progress Notes (Signed)
ED Antimicrobial Stewardship Positive Culture Follow Up  ? ?Kristin Hickman is an 51 y.o. female who presented to [facility] on 06/05/2021 with a chief complaint of No chief complaint on file. ? ? ?Recent Results (from the past 720 hour(s))  ?Culture, blood (single)     Status: None  ? Collection Time: 05/11/21  8:58 PM  ? Specimen: BLOOD  ?Result Value Ref Range Status  ? Specimen Description   Final  ?  BLOOD LEFT ANTECUBITAL ?Performed at [facility], [address], [place], [place] [address] ?  ? Special Requests   Final  ?  Blood Culture adequate volume BOTTLES DRAWN AEROBIC AND ANAEROBIC ?Performed at [facility], [address], [place], [place] [address] ?  ? Culture   Final  ?  NO GROWTH 5 DAYS ?Performed at [facility], [address] [place], [place] [address] ?  ? Report Status 05/17/2021 FINAL  Final  ?Aerobic Culture w Gram Stain (superficial specimen)     Status: None  ? Collection Time: 06/05/21  8:03 PM  ? Specimen: Wound  ?Result Value Ref Range Status  ? Specimen Description   Final  ?  WOUND RIGHT HAND ?Performed at [facility], [address], [place], [place] [address] ?  ? Special Requests   Final  ?  NONE ?Performed at [facility], [address], [place], [place] [address] ?  ? Gram Stain   Final  ?  RARE WBC PRESENT, PREDOMINANTLY MONONUCLEAR ?RARE GRAM POSITIVE COCCI IN PAIRS ?  ? Culture   Final  ?  ABUNDANT GROUP A STREP (S.PYOGENES) ISOLATED ?Beta hemolytic streptococci are predictably susceptible to penicillin and other beta lactams. Susceptibility testing not routinely performed. ?Performed at [facility], [address] [place], [place] [address] ?  ? Report Status 06/08/2021 FINAL  Final  ? ? ?[x]  Treated with Bactrim, organism resistant to prescribed antimicrobial ?[]  Patient discharged originally without antimicrobial agent and treatment is now indicated ? ?New antibiotic prescription:  Add Cephalexin 500 mg TID X 7 days  ? ?ED Provider: , PA-C  ? ? , PharmD, BCPS, BCIDP ?Infectious Diseases Clinical Pharmacist ?Phone: [phone] ?06/09/2021, 9:00 AM ?Clinical Pharmacist ?Monday - Friday phone -  [phone] ?Saturday - Sunday phone - [phone] ? ?

## 2022-12-06 ENCOUNTER — Encounter: Payer: Commercial Managed Care - HMO | Admitting: Family

## 2022-12-06 NOTE — Progress Notes (Signed)
Erroneous encounter-disregard

## 2022-12-10 ENCOUNTER — Encounter (HOSPITAL_COMMUNITY): Payer: Self-pay

## 2022-12-10 ENCOUNTER — Emergency Department (HOSPITAL_COMMUNITY)
Admission: EM | Admit: 2022-12-10 | Discharge: 2022-12-11 | Disposition: A | Discharge status: Home / Self Care | Payer: Commercial Managed Care - HMO | Attending: Emergency Medicine | Admitting: Emergency Medicine

## 2022-12-10 ENCOUNTER — Other Ambulatory Visit: Payer: Self-pay

## 2022-12-10 DIAGNOSIS — Z5321 Procedure and treatment not carried out due to patient leaving prior to being seen by health care provider: Secondary | ICD-10-CM | POA: Insufficient documentation

## 2022-12-10 DIAGNOSIS — R531 Weakness: Secondary | ICD-10-CM | POA: Insufficient documentation

## 2022-12-10 DIAGNOSIS — R109 Unspecified abdominal pain: Secondary | ICD-10-CM | POA: Insufficient documentation

## 2022-12-10 DIAGNOSIS — R1084 Generalized abdominal pain: Secondary | ICD-10-CM | POA: Insufficient documentation

## 2022-12-10 DIAGNOSIS — K30 Functional dyspepsia: Secondary | ICD-10-CM | POA: Insufficient documentation

## 2022-12-10 DIAGNOSIS — R42 Dizziness and giddiness: Secondary | ICD-10-CM | POA: Insufficient documentation

## 2022-12-10 DIAGNOSIS — R634 Abnormal weight loss: Secondary | ICD-10-CM | POA: Insufficient documentation

## 2022-12-10 LAB — URINALYSIS, ROUTINE W REFLEX MICROSCOPIC
Bilirubin Urine: NEGATIVE
Glucose, UA: NEGATIVE mg/dL
Ketones, ur: NEGATIVE mg/dL
Nitrite: NEGATIVE
Protein, ur: NEGATIVE mg/dL
Specific Gravity, Urine: 1.005 (ref 1.005–1.030)
pH: 8 (ref 5.0–8.0)

## 2022-12-10 LAB — COMPREHENSIVE METABOLIC PANEL
ALT: 21 U/L (ref 0–44)
AST: 25 U/L (ref 15–41)
Albumin: 3.8 g/dL (ref 3.5–5.0)
Alkaline Phosphatase: 57 U/L (ref 38–126)
Anion gap: 12 (ref 5–15)
BUN: 6 mg/dL (ref 6–20)
CO2: 26 mmol/L (ref 22–32)
Calcium: 9.1 mg/dL (ref 8.9–10.3)
Chloride: 100 mmol/L (ref 98–111)
Creatinine, Ser: 0.84 mg/dL (ref 0.44–1.00)
GFR, Estimated: >60 mL/min (ref >60)
Glucose, Bld: 94 mg/dL (ref 70–99)
Potassium: 3.3 mmol/L — ABNORMAL LOW (ref 3.5–5.1)
Sodium: 138 mmol/L (ref 135–145)
Total Bilirubin: 1 mg/dL (ref 0.3–1.2)
Total Protein: 6.5 g/dL (ref 6.5–8.1)

## 2022-12-10 LAB — CBC
HCT: 45.3 % (ref 36.0–46.0)
Hemoglobin: 15.1 g/dL — ABNORMAL HIGH (ref 12.0–15.0)
MCH: 31.5 pg (ref 26.0–34.0)
MCHC: 33.3 g/dL (ref 30.0–36.0)
MCV: 94.4 fL (ref 80.0–100.0)
Platelets: 194 10*3/uL (ref 150–400)
RBC: 4.8 MIL/uL (ref 3.87–5.11)
RDW: 14.1 % (ref 11.5–15.5)
WBC: 5.2 10*3/uL (ref 4.0–10.5)
nRBC: 0 % (ref 0.0–0.2)

## 2022-12-10 LAB — HCG, SERUM, QUALITATIVE: Preg, Serum: NEGATIVE

## 2022-12-10 LAB — LIPASE, BLOOD: Lipase: 26 U/L (ref 11–51)

## 2022-12-10 NOTE — ED Triage Notes (Signed)
Pt c/o mid abdominal pain, lightheaded, dizzy, generalized weaknessx4d. Pt denies N/V.

## 2022-12-11 ENCOUNTER — Encounter (HOSPITAL_BASED_OUTPATIENT_CLINIC_OR_DEPARTMENT_OTHER): Payer: Self-pay | Admitting: Urology

## 2022-12-11 ENCOUNTER — Emergency Department (HOSPITAL_BASED_OUTPATIENT_CLINIC_OR_DEPARTMENT_OTHER): Payer: Commercial Managed Care - HMO

## 2022-12-11 ENCOUNTER — Emergency Department (HOSPITAL_BASED_OUTPATIENT_CLINIC_OR_DEPARTMENT_OTHER)
Admission: EM | Admit: 2022-12-11 | Discharge: 2022-12-11 | Disposition: A | Discharge status: Home / Self Care | Payer: Commercial Managed Care - HMO | Source: Home / Self Care | Attending: Emergency Medicine | Admitting: Emergency Medicine

## 2022-12-11 ENCOUNTER — Other Ambulatory Visit: Payer: Self-pay

## 2022-12-11 DIAGNOSIS — R634 Abnormal weight loss: Secondary | ICD-10-CM | POA: Insufficient documentation

## 2022-12-11 DIAGNOSIS — K30 Functional dyspepsia: Secondary | ICD-10-CM | POA: Insufficient documentation

## 2022-12-11 DIAGNOSIS — R1084 Generalized abdominal pain: Secondary | ICD-10-CM | POA: Diagnosis not present

## 2022-12-11 LAB — COMPREHENSIVE METABOLIC PANEL
ALT: 15 U/L (ref 0–44)
AST: 19 U/L (ref 15–41)
Albumin: 4.2 g/dL (ref 3.5–5.0)
Alkaline Phosphatase: 58 U/L (ref 38–126)
Anion gap: 8 (ref 5–15)
BUN: 9 mg/dL (ref 6–20)
CO2: 29 mmol/L (ref 22–32)
Calcium: 9.4 mg/dL (ref 8.9–10.3)
Chloride: 102 mmol/L (ref 98–111)
Creatinine, Ser: 0.76 mg/dL (ref 0.44–1.00)
GFR, Estimated: >60 mL/min (ref >60)
Glucose, Bld: 111 mg/dL — ABNORMAL HIGH (ref 70–99)
Potassium: 3.9 mmol/L (ref 3.5–5.1)
Sodium: 139 mmol/L (ref 135–145)
Total Bilirubin: 1.1 mg/dL (ref 0.3–1.2)
Total Protein: 6.7 g/dL (ref 6.5–8.1)

## 2022-12-11 LAB — LIPASE, BLOOD: Lipase: 11 U/L (ref 11–51)

## 2022-12-11 LAB — CBC
HCT: 45.5 % (ref 36.0–46.0)
Hemoglobin: 15.5 g/dL — ABNORMAL HIGH (ref 12.0–15.0)
MCH: 32 pg (ref 26.0–34.0)
MCHC: 34.1 g/dL (ref 30.0–36.0)
MCV: 93.8 fL (ref 80.0–100.0)
Platelets: 237 10*3/uL (ref 150–400)
RBC: 4.85 MIL/uL (ref 3.87–5.11)
RDW: 14.3 % (ref 11.5–15.5)
WBC: 4.8 10*3/uL (ref 4.0–10.5)
nRBC: 0 % (ref 0.0–0.2)

## 2022-12-11 MED ORDER — ONDANSETRON HCL 4 MG/2ML IJ SOLN
4.0000 mg | Freq: Once | INTRAMUSCULAR | Status: AC
  Administered 2022-12-11: 4 mg via INTRAVENOUS
  Filled 2022-12-11: qty 2

## 2022-12-11 MED ORDER — PANTOPRAZOLE SODIUM 40 MG IV SOLR
40.0000 mg | Freq: Once | INTRAVENOUS | Status: AC
Start: 2022-12-11 — End: 2022-12-11
  Administered 2022-12-11: 40 mg via INTRAVENOUS
  Filled 2022-12-11: qty 10

## 2022-12-11 MED ORDER — FENTANYL CITRATE PF 50 MCG/ML IJ SOSY
50.0000 ug | PREFILLED_SYRINGE | INTRAMUSCULAR | Status: DC | PRN
  Administered 2022-12-11: 50 ug via INTRAVENOUS
  Filled 2022-12-11: qty 1

## 2022-12-11 MED ORDER — LACTATED RINGERS IV BOLUS
2000.0000 mL | Freq: Once | INTRAVENOUS | Status: AC
  Administered 2022-12-11: 2000 mL via INTRAVENOUS

## 2022-12-11 MED ORDER — ONDANSETRON HCL 4 MG PO TABS: 4.0000 mg | ORAL_TABLET | Freq: Four times a day (QID) | ORAL | 0 refills | Status: DC

## 2022-12-11 MED ORDER — PANTOPRAZOLE SODIUM 20 MG PO TBEC: 20.0000 mg | DELAYED_RELEASE_TABLET | Freq: Every day | ORAL | 0 refills | Status: AC

## 2022-12-11 MED ORDER — IOHEXOL 300 MG/ML  SOLN
100.0000 mL | Freq: Once | INTRAMUSCULAR | Status: AC | PRN
Start: 2022-12-11 — End: 2022-12-11
  Administered 2022-12-11: 70 mL via INTRAVENOUS

## 2022-12-11 NOTE — ED Notes (Signed)
Pt. Requesting to go outside and smoke. Explained to patient this is a non-smoking facility and were unable to let her smoke.

## 2022-12-11 NOTE — ED Provider Notes (Signed)
Kristin Hickman EMERGENCY DEPARTMENT AT [facility] Provider Note   CSN: [id number] Arrival date & time: 12/11/22  1354     History Chief Complaint  Patient presents with   Abdominal Pain    HPI Kristin Hickman is a 52 y.o. female presenting for multiple symptoms.  Primarily she is endorsing severe abdominal pain over the last 5 days which brings her in today.  States that over the past year and a half she has lost approximately 120 pounds.  She was morbidly obese so initially patient states that she was happy with the weight loss but now has developed complete p.o. intolerance.  She is having severe dyspepsia and discomfort after every meal.  States that over the last 5 days every time she goes to try to eat or drink anything she gets severe abdominal pain and cramping.  Was hoping it would resolve.  Was out of healthcare insurance was not been evaluated in this time.  Endorses alcohol use daily but denies smoking, otherwise ambulatory.  Endorses chills without fever. Denies chest pain or shortness of breath.Kristin Hickman a vague history of Addison's disease but is currently not on any medications and has been lost to follow-up.  Patient's recorded medical, surgical, social, medication list and allergies were reviewed in the Snapshot window as part of the initial history.   Review of Systems   Review of Systems  Constitutional:  Negative for chills and fever.  HENT:  Negative for ear pain and sore throat.   Eyes:  Negative for pain and visual disturbance.  Respiratory:  Negative for cough and shortness of breath.   Cardiovascular:  Negative for chest pain and palpitations.  Gastrointestinal:  Positive for abdominal pain and nausea. Negative for diarrhea and vomiting.  Genitourinary:  Negative for dysuria and hematuria.  Musculoskeletal:  Negative for arthralgias and back pain.  Skin:  Negative for color change and rash.  Neurological:  Negative for seizures and syncope.  All other  systems reviewed and are negative.   Physical Exam Updated Vital Signs BP (!) 152/93   Pulse 77   Temp 98.3 F (36.8 C) (Oral)   Resp 12   Ht 5\' 6"  (1.676 m)   Wt 62.6 kg   SpO2 100%   BMI 22.28 kg/m  Physical Exam Vitals and nursing note reviewed.  Constitutional:      General: She is not in acute distress.    Appearance: She is well-developed.  HENT:     Head: Normocephalic and atraumatic.  Eyes:     Conjunctiva/sclera: Conjunctivae normal.  Cardiovascular:     Rate and Rhythm: Normal rate and regular rhythm.     Heart sounds: No murmur heard. Pulmonary:     Effort: Pulmonary effort is normal. No respiratory distress.     Breath sounds: Normal breath sounds.  Abdominal:     General: There is no distension.     Palpations: Abdomen is soft.     Tenderness: There is abdominal tenderness. There is no right CVA tenderness, left CVA tenderness or guarding.  Musculoskeletal:        General: No swelling.     Cervical back: Neck supple.  Skin:    General: Skin is warm and dry.     Capillary Refill: Capillary refill takes less than 2 seconds.  Neurological:     Mental Status: She is alert.  Psychiatric:        Mood and Affect: Mood normal.      ED Course/ Medical  Decision Making/ A&P    Procedures Procedures   Medications Ordered in ED Medications  fentaNYL (SUBLIMAZE) injection 50 mcg (50 mcg Intravenous Given 12/11/22 1609)  lactated ringers bolus 2,000 mL (2,000 mLs Intravenous New Bag/Given 12/11/22 1618)  ondansetron (ZOFRAN) injection 4 mg (4 mg Intravenous Given 12/11/22 1608)  pantoprazole (PROTONIX) injection 40 mg (40 mg Intravenous Given 12/11/22 1609)  iohexol (OMNIPAQUE) 300 MG/ML solution 100 mL (70 mLs Intravenous Contrast Given 12/11/22 1624)   Medical Decision Making:   Kristin Hickman is a 52 y.o. female who presented to the ED today with abdominal pain, detailed above.    Patient placed on continuous vitals and telemetry monitoring while in ED  which was reviewed periodically.  Complete initial physical exam performed, notably the patient  was hemodynamically stable in no acute distress.  Exquisitely tender abdomen on exam..     Reviewed and confirmed nursing documentation for past medical history, family history, social history.    Initial Assessment:   This is most consistent with an acute life/limb threatening illness complicated by underlying chronic conditions. This is a concerning and atypical presentation.  Patient initially tachycardic on arrival as well as hypertensive.  Her symptoms are relatively severe and protracted in nature.  Obstruction, malignancy, pancreatitis, appendicitis, cholecystitis are all on the differential and difficult to differentiate given her severe pain at this time.  Pulmonary etiology may be related given her reported fatigue. Given severity of her presentation and profound nature of weight loss (100 to 120 pounds per patient unintentionally) will evaluate broadly with CT chest abdomen pelvis with contrast for either infectious or structural pathology.  Will additionally evaluate as below. Initial Plan:  CBC/CMP to evaluate for underlying infectious/metabolic etiology for patient's abdominal pain  Lipase to evaluate for pancreatitis  EKG to evaluate for cardiac source of pain  Urinalysis and repeat physical assessment to evaluate for UTI/Pyelonpehritis  Empiric management of symptoms with escalating pain control and antiemetics as needed.   Initial Study Results:   Laboratory  All laboratory results reviewed without evidence of clinically relevant pathology.   EKG EKG was reviewed independently. Rate, rhythm, axis, intervals all examined and without medically relevant abnormality. ST segments without concerns for elevations.    Radiology All images reviewed independently. Agree with radiology report at this time.   CT CHEST ABDOMEN PELVIS W CONTRAST  Result Date: 12/11/2022 CLINICAL DATA:   Unintentional weight loss and abdominal pain. Decreased p.o. intake. EXAM: CT CHEST, ABDOMEN, AND PELVIS WITH CONTRAST TECHNIQUE: Multidetector CT imaging of the chest, abdomen and pelvis was performed following the standard protocol during bolus administration of intravenous contrast. RADIATION DOSE REDUCTION: This exam was performed according to the departmental dose-optimization program which includes automated exposure control, adjustment of the mA and/or kV according to patient size and/or use of iterative reconstruction technique. CONTRAST:  70mL OMNIPAQUE IOHEXOL 300 MG/ML  SOLN COMPARISON:  Chest CTA 09/24/2015 FINDINGS: CT CHEST FINDINGS Cardiovascular: The heart is normal in size. No pericardial effusion. Occasional coronary artery calcifications. Mild aortic atherosclerosis. No aortic aneurysm. No central pulmonary embolus. Mediastinum/Nodes: No mediastinal or hilar adenopathy. No thyroid nodule. No enlarged axillary lymph nodes. The esophagus is decompressed. No wall thickening. Lungs/Pleura: Mild emphysema. Bronchial thickening. No suspicious pulmonary mass. Occasional tiny subpleural nodules. For example series 4, image 39 in the right upper lobe (unchanged from 2017 considered benign), right lower lobe series 4, image 90, and anterior left upper lobe series 4, image 65, not definitively seen on prior. No focal airspace  disease. No pleural effusion. The trachea and central airways are clear. Musculoskeletal: There are no acute or suspicious osseous abnormalities. Mild thoracic spondylosis with spurring. No chest wall soft tissue abnormalities or obvious breast mass. CT ABDOMEN PELVIS FINDINGS Hepatobiliary: Hepatic steatosis on prior has resolved. No focal liver abnormality. Layering hyperdensity in the gallbladder may represent sludge or stones. No pericholecystic inflammation. No biliary dilatation. Pancreas: Detail pancreatic assessment is limited in the absence of enteric contrast and paucity of  intra-abdominal fat. No evidence of pancreatic mass. No ductal dilatation or inflammation. Spleen: Normal in size without focal abnormality. Adrenals/Urinary Tract: Normal right adrenal gland. Left adrenal gland is difficult to delineate. No hydronephrosis. No renal calculi or suspicious renal abnormality. The urinary bladder is nondistended and not well assessed. Stomach/Bowel: Detailed bowel assessment is limited in the absence of enteric contrast. There is no obvious gastric wall thickening. Majority of small bowel is decompressed, further limiting assessment. Small to moderate volume of colonic stool in the proximal colon. The sigmoid colon is not well assessed on the current exam due to decompression. There is no obvious colonic mass allowing for limited bowel evaluation. Vascular/Lymphatic: Aortic atherosclerosis. No aneurysm. Patent portal, splenic and mesenteric veins. No bulky adenopathy. Reproductive: Area of low-density in the uterine fundus measuring 3.3 cm may represent a uterine fibroid, although uterine and adnexal assessment is suboptimal. The ovaries are not well delineated. Other: No ascites. No definite omental thickening. No abdominal wall hernia. Musculoskeletal: L5-S1 degenerative scratch at L4-L5 degenerative disc disease. There are no acute or suspicious osseous abnormalities. Hemi transitional lumbosacral anatomy. IMPRESSION: 1. No acute abnormality in the chest, abdomen, or pelvis. No explanation for weight loss. 2. Emphysema with bronchial thickening. There are tiny milli metric punctate subpleural nodules in both lungs are nonspecific, although distribution favors benign etiology. Consider 1 year follow-up. 3. Layering hyperdensity in the gallbladder may represent sludge or stones. No pericholecystic inflammation. 4. Area of low-density in the uterine fundus measuring 3.3 cm may represent a uterine fibroid, although uterine and adnexal assessment is suboptimal. Recommend pelvic ultrasound  for further evaluation. Emphysema (ICD10-J43.9).  Aortic Atherosclerosis (ICD10-I70.0). Electronically Signed   By: Narda Rutherford M.D.   On: 12/11/2022 17:47     Final Reassessment and Plan:   Patient is objective findings without any focal pathology. I was called to bedside by patient's bedside nurse as patient is now requesting discharge.  All of her symptoms have recovered after treatment with IV fluids, Protonix and fentanyl and she feels able to tolerate p.o. intake.  Heart rate has improved dramatically from 120s to 70s with 2 L IV fluid.  Discussed with patient no severe pathology detected on extensive evaluation today.  Her syndrome is most consistent with developing peptic ulcer disease but would like to have her tolerate p.o. intake in the emergency room.  If she was unable to tolerate p.o. intake, with plan for admission for intolerance of p.o.  However she has refused any further management is requesting immediate discharge.  She has expressed understanding of risk of missed underlying disease given limitations in evaluation today but would still like to be discharged.  Will refer to gastroenterology for further workup in the outpatient setting.  Will trial PPI supportive care with strict return precautions reinforced.  Disposition:  Patient is requesting discharge at this time.  Given patient's understanding of risk of severe missed diagnosis based on limitations of today's evaluation and risk of interval worsening of disease including life or limb threatening pathology, will  participate in shared medical decision making and patient directed discharge at this time.  Patient is welcome to return for further diagnostic evaluation/therapeutic management at any time.      Clinical Impression:  1. Weight loss   2. Generalized abdominal pain      Discharge   Final Clinical Impression(s) / ED Diagnoses Final diagnoses:  Weight loss  Generalized abdominal pain    Rx / DC Orders ED  Discharge Orders          Ordered    pantoprazole (PROTONIX) 20 MG tablet  Daily        12/11/22 1849    ondansetron (ZOFRAN) 4 MG tablet  Every 6 hours        12/11/22 1849              Glyn Ade, MD 12/11/22 1850

## 2022-12-11 NOTE — ED Notes (Signed)
Pt name called for updated vitals, no response 

## 2022-12-11 NOTE — ED Triage Notes (Signed)
Pt LWBS from [facility] ED yesterday 1 week ago had dental infection, unable to get antibiotics Approx 4 days ago started having abd pain  States "ripping pain" states everything tastes and smells like sulfer  Denies N/V  Pain now radiating to back  Reports recent weight loss without trying

## 2023-04-15 ENCOUNTER — Ambulatory Visit (INDEPENDENT_AMBULATORY_CARE_PROVIDER_SITE_OTHER): Payer: Commercial Managed Care - HMO | Admitting: Family Medicine

## 2023-04-15 ENCOUNTER — Encounter: Payer: Self-pay | Admitting: Family Medicine

## 2023-04-15 VITALS — BP 138/92 | HR 85 | Temp 98.4°F | Resp 18 | Ht 67.0 in | Wt 101.8 lb

## 2023-04-15 DIAGNOSIS — F32A Depression, unspecified: Secondary | ICD-10-CM

## 2023-04-15 DIAGNOSIS — R030 Elevated blood-pressure reading, without diagnosis of hypertension: Secondary | ICD-10-CM

## 2023-04-15 DIAGNOSIS — F419 Anxiety disorder, unspecified: Secondary | ICD-10-CM | POA: Diagnosis not present

## 2023-04-15 DIAGNOSIS — R634 Abnormal weight loss: Secondary | ICD-10-CM | POA: Diagnosis not present

## 2023-04-15 DIAGNOSIS — Z5986 Financial insecurity: Secondary | ICD-10-CM

## 2023-04-15 DIAGNOSIS — R109 Unspecified abdominal pain: Secondary | ICD-10-CM

## 2023-04-15 DIAGNOSIS — Z7689 Persons encountering health services in other specified circumstances: Secondary | ICD-10-CM

## 2023-04-15 DIAGNOSIS — F17200 Nicotine dependence, unspecified, uncomplicated: Secondary | ICD-10-CM

## 2023-04-15 MED ORDER — SERTRALINE HCL 50 MG PO TABS: 50.0000 mg | ORAL_TABLET | Freq: Every day | ORAL | 1 refills | Status: AC

## 2023-04-15 NOTE — Progress Notes (Signed)
 New Patient Office Visit  Subjective    Patient ID: Kristin Hickman, female    DOB: 1971/01/10  Age: 53 y.o. MRN: [id number]  CC:  Chief Complaint  Patient presents with   Establish Care    Can't sleep and eat.  She has loss over 80 pounds in the last couple of months, her hands are really white and cold and cramps up, very dizzy.  Patient feels like she is 53 years old    HPI Kristin Hickman presents to establish care and with numerous complaints. She has not seen a provider in years. Her main complaint is malaise. She reorts having lost weight from 300lbs and with abdominal pain. She denies stool changes. She is a smoker with many pack year history. She also reports many and increasing social stressors.    Outpatient Encounter Medications as of 04/15/2023  Medication Sig   sertraline  (ZOLOFT ) 50 MG tablet Take 1 tablet (50 mg total) by mouth daily.   acamprosate (CAMPRAL) 333 MG tablet Take 666 mg by mouth 3 (three) times daily. (Patient not taking: Reported on 04/15/2023)   amoxicillin  (AMOXIL ) 250 MG capsule Take by mouth. (Patient not taking: Reported on 04/15/2023)   buprenorphine (SUBUTEX) 8 MG SUBL SL tablet Place 2.5 tablets under the tongue daily. (Patient not taking: Reported on 04/15/2023)   dicyclomine  (BENTYL ) 20 MG tablet Take 20 mg by mouth every 8 (eight) hours as needed. (Patient not taking: Reported on 04/15/2023)   EYSUVIS 0.25 % SUSP Apply 1 drop to eye 4 (four) times daily. (Patient not taking: Reported on 04/15/2023)   gabapentin (NEURONTIN) 300 MG capsule Take 300 mg by mouth 3 (three) times daily as needed. (Patient not taking: Reported on 04/15/2023)   hydrOXYzine (VISTARIL) 50 MG capsule Take 50 mg by mouth every 6 (six) hours as needed. (Patient not taking: Reported on 04/15/2023)   ondansetron  (ZOFRAN ) 4 MG tablet Take 1 tablet (4 mg total) by mouth every 6 (six) hours. (Patient not taking: Reported on 04/15/2023)   ondansetron  (ZOFRAN -ODT) 4 MG disintegrating tablet  Take 4 mg by mouth 2 (two) times daily as needed. (Patient not taking: Reported on 04/15/2023)   pantoprazole  (PROTONIX ) 20 MG tablet Take 1 tablet (20 mg total) by mouth daily.   RESTASIS 0.05 % ophthalmic emulsion Place 1 drop into both eyes 2 (two) times daily. (Patient not taking: Reported on 04/15/2023)   No facility-administered encounter medications on file as of 04/15/2023.    Past Medical History:  Diagnosis Date   Addison disease (HCC)    Hypertension     Past Surgical History:  Procedure Laterality Date   BACK SURGERY     saliva gland tumor removal Right     History reviewed. No pertinent family history.  Social History   Socioeconomic History   Marital status: Single    Spouse name: Not on file   Number of children: Not on file   Years of education: Not on file   Highest education level: Not on file  Occupational History   Not on file  Tobacco Use   Smoking status: Every Day    Current packs/day: 1.00    Types: Cigarettes   Smokeless tobacco: Never  Vaping Use   Vaping status: Never Used  Substance and Sexual Activity   Alcohol use: Yes    Alcohol/week: 7.0 standard drinks of alcohol    Types: 7 Cans of beer per week   Drug use: No   Sexual activity: Not  on file  Other Topics Concern   Not on file  Social History Narrative   Not on file   Social Drivers of Health   Financial Resource Strain: High Risk (04/15/2023)   Overall Financial Resource Strain (CARDIA)    Difficulty of Paying Living Expenses: Hard  Food Insecurity: Food Insecurity Present (04/15/2023)   Hunger Vital Sign    Worried About Running Out of Food in the Last Year: Sometimes true    Ran Out of Food in the Last Year: Sometimes true  Transportation Needs: Unmet Transportation Needs (04/15/2023)   PRAPARE - Transportation    Lack of Transportation (Medical): Yes    Lack of Transportation (Non-Medical): Yes  Physical Activity: Inactive (04/15/2023)   Exercise Vital Sign    Days of  Exercise per Week: 0 days    Minutes of Exercise per Session: 0 min  Stress: Stress Concern Present (04/15/2023)   [employer] of Occupational Health - Occupational Stress Questionnaire    Feeling of Stress : Very much  Social Connections: Socially Isolated (04/15/2023)   Social Connection and Isolation Panel [NHANES]    Frequency of Communication with Friends and Family: Never    Frequency of Social Gatherings with Friends and Family: Never    Attends Religious Services: Never    Database administrator or Organizations: No    Attends Banker Meetings: Never    Marital Status: Divorced  Catering manager Violence: Not At Risk (04/15/2023)   Humiliation, Afraid, Rape, and Kick questionnaire    Fear of Current or Ex-Partner: No    Emotionally Abused: No    Physically Abused: No    Sexually Abused: No    Review of Systems  Constitutional:  Positive for malaise/fatigue and weight loss.  Gastrointestinal:  Positive for abdominal pain.  Psychiatric/Behavioral:  Positive for depression. Negative for suicidal ideas. The patient is nervous/anxious.         Objective   BP (!) 138/92 (BP Location: Right Arm, Patient Position: Sitting, Cuff Size: Small)   Pulse 85   Temp 98.4 F (36.9 C) (Oral)   Resp 18   Ht 5\' 7"  (1.702 m)   Wt 101 lb 12.8 oz (46.2 kg)   SpO2 95%   BMI 15.94 kg/m   Physical Exam Vitals and nursing note reviewed.  Constitutional:      General: She is not in acute distress. Cardiovascular:     Rate and Rhythm: Normal rate and regular rhythm.  Pulmonary:     Effort: Pulmonary effort is normal.     Breath sounds: Normal breath sounds.  Abdominal:     General: There is no distension.     Palpations: Abdomen is soft.     Tenderness: There is abdominal tenderness.  Neurological:     General: No focal deficit present.     Mental Status: She is alert and oriented to person, place, and time.  Psychiatric:        Mood and Affect: Mood is anxious  and depressed. Affect is tearful.         Assessment & Plan:   Loss of weight -     CBC with Differential/Platelet -     CMP14+EGFR -     Ambulatory referral to Gastroenterology  Abdominal pain, unspecified abdominal location -     CBC with Differential/Platelet -     CMP14+EGFR -     Ambulatory referral to Gastroenterology  Anxiety and depression  Elevated blood pressure reading in office without  diagnosis of hypertension  Financial insecurity -     AMB Referral [facility] Care Management  Smoker -     CT CHEST LUNG CANCER SCREENING LOW DOSE WO CONTRAST; Future  Encounter to establish care  Other orders -     Sertraline  HCl; Take 1 tablet (50 mg total) by mouth daily.  Dispense: 30 tablet; Refill: 1     Return in about 4 weeks (around 05/13/2023) for follow up, chronic med issues.   Arlo Lama, MD

## 2023-04-16 ENCOUNTER — Telehealth: Payer: Self-pay | Admitting: *Deleted

## 2023-04-16 ENCOUNTER — Encounter: Payer: Self-pay | Admitting: Family Medicine

## 2023-04-16 LAB — CBC WITH DIFFERENTIAL/PLATELET
Basophils Absolute: 0 10*3/uL (ref 0.0–0.2)
Basos: 0 %
EOS (ABSOLUTE): 0.1 10*3/uL (ref 0.0–0.4)
Eos: 2 %
Hematocrit: 42.3 % (ref 34.0–46.6)
Hemoglobin: 13.7 g/dL (ref 11.1–15.9)
Immature Grans (Abs): 0 10*3/uL (ref 0.0–0.1)
Immature Granulocytes: 0 %
Lymphocytes Absolute: 1.4 10*3/uL (ref 0.7–3.1)
Lymphs: 26 %
MCH: 31.2 pg (ref 26.6–33.0)
MCHC: 32.4 g/dL (ref 31.5–35.7)
MCV: 96 fL (ref 79–97)
Monocytes Absolute: 0.5 10*3/uL (ref 0.1–0.9)
Monocytes: 9 %
Neutrophils Absolute: 3.4 10*3/uL (ref 1.4–7.0)
Neutrophils: 63 %
Platelets: 235 10*3/uL (ref 150–450)
RBC: 4.39 x10E6/uL (ref 3.77–5.28)
RDW: 13.8 % (ref 11.7–15.4)
WBC: 5.4 10*3/uL (ref 3.4–10.8)

## 2023-04-16 LAB — CMP14+EGFR
ALT: 12 [IU]/L (ref 0–32)
AST: 19 [IU]/L (ref 0–40)
Albumin: 4.2 g/dL (ref 3.8–4.9)
Alkaline Phosphatase: 94 [IU]/L (ref 44–121)
BUN/Creatinine Ratio: 14 (ref 9–23)
BUN: 10 mg/dL (ref 6–24)
Bilirubin Total: 0.7 mg/dL (ref 0.0–1.2)
CO2: 24 mmol/L (ref 20–29)
Calcium: 9.3 mg/dL (ref 8.7–10.2)
Chloride: 100 mmol/L (ref 96–106)
Creatinine, Ser: 0.69 mg/dL (ref 0.57–1.00)
Globulin, Total: 2.2 g/dL (ref 1.5–4.5)
Glucose: 84 mg/dL (ref 70–99)
Potassium: 4.2 mmol/L (ref 3.5–5.2)
Sodium: 140 mmol/L (ref 134–144)
Total Protein: 6.4 g/dL (ref 6.0–8.5)
eGFR: 104 mL/min/{1.73_m2} (ref >59)

## 2023-04-16 NOTE — Progress Notes (Signed)
Complex Care Management Note Care Guide Note  04/16/2023 Name: Kristin Hickman MRN: [id number] DOB: [birth year]   Complex Care Management Outreach Attempts: An unsuccessful telephone outreach was attempted today to offer the patient information about available complex care management services.  Follow Up Plan:  Additional outreach attempts will be made to offer the patient complex care management information and services.   Encounter Outcome:  No Answer  Gwenevere Ghazi  [facility]  [facility] Guide  Direct Dial: [phone]  Fax [phone]

## 2023-04-17 ENCOUNTER — Encounter: Payer: Self-pay | Admitting: Physician Assistant

## 2023-04-19 NOTE — Progress Notes (Signed)
Complex Care Management Note Care Guide Note  04/19/2023 Name: Kristin Hickman MRN: [id number] DOB: 06/19/1970   Complex Care Management Outreach Attempts: A second unsuccessful outreach was attempted today to offer the patient with information about available complex care management services.  Follow Up Plan:  Additional outreach attempts will be made to offer the patient complex care management information and services.   Encounter Outcome:  No Answer  Gwenevere Ghazi  [facility]  [facility] Guide  Direct Dial: [phone]  Fax [phone]

## 2023-04-23 NOTE — Progress Notes (Signed)
 Complex Care Management Note Care Guide Note  04/23/2023 Name: DARREL BARONI MRN: [id number] DOB: Sep 06, 1970   Complex Care Management Outreach Attempts: A third unsuccessful outreach was attempted today to offer the patient with information about available complex care management services.  Follow Up Plan:  No further outreach attempts will be made at this time. We have been unable to contact the patient to offer or enroll patient in complex care management services.  Encounter Outcome:  No Answer  Gwenevere Ghazi  [facility]  [facility] Guide  Direct Dial: [phone]  Fax [phone]

## 2023-04-25 ENCOUNTER — Ambulatory Visit: Payer: Self-pay | Admitting: Family Medicine

## 2023-04-25 ENCOUNTER — Emergency Department (HOSPITAL_COMMUNITY)
Admission: EM | Admit: 2023-04-25 | Discharge: 2023-04-25 | Disposition: A | Discharge status: Home / Self Care | Payer: Commercial Managed Care - HMO | Attending: Emergency Medicine | Admitting: Emergency Medicine

## 2023-04-25 ENCOUNTER — Other Ambulatory Visit: Payer: Self-pay

## 2023-04-25 ENCOUNTER — Encounter (HOSPITAL_COMMUNITY): Payer: Self-pay | Admitting: Emergency Medicine

## 2023-04-25 ENCOUNTER — Emergency Department (HOSPITAL_COMMUNITY): Payer: Commercial Managed Care - HMO

## 2023-04-25 DIAGNOSIS — R42 Dizziness and giddiness: Secondary | ICD-10-CM | POA: Diagnosis not present

## 2023-04-25 DIAGNOSIS — K8051 Calculus of bile duct without cholangitis or cholecystitis with obstruction: Secondary | ICD-10-CM | POA: Diagnosis not present

## 2023-04-25 DIAGNOSIS — I10 Essential (primary) hypertension: Secondary | ICD-10-CM | POA: Insufficient documentation

## 2023-04-25 DIAGNOSIS — R630 Anorexia: Secondary | ICD-10-CM | POA: Insufficient documentation

## 2023-04-25 LAB — URINALYSIS, ROUTINE W REFLEX MICROSCOPIC
Bilirubin Urine: NEGATIVE
Glucose, UA: NEGATIVE mg/dL
Ketones, ur: NEGATIVE mg/dL
Nitrite: NEGATIVE
Protein, ur: NEGATIVE mg/dL
Specific Gravity, Urine: 1.013 (ref 1.005–1.030)
pH: 8 (ref 5.0–8.0)

## 2023-04-25 LAB — CBC
HCT: 45.5 % (ref 36.0–46.0)
Hemoglobin: 15.2 g/dL — ABNORMAL HIGH (ref 12.0–15.0)
MCH: 32.4 pg (ref 26.0–34.0)
MCHC: 33.4 g/dL (ref 30.0–36.0)
MCV: 97 fL (ref 80.0–100.0)
Platelets: 255 10*3/uL (ref 150–400)
RBC: 4.69 MIL/uL (ref 3.87–5.11)
RDW: 13.6 % (ref 11.5–15.5)
WBC: 6.1 10*3/uL (ref 4.0–10.5)
nRBC: 0 % (ref 0.0–0.2)

## 2023-04-25 LAB — HCG, SERUM, QUALITATIVE: Preg, Serum: NEGATIVE

## 2023-04-25 LAB — BASIC METABOLIC PANEL
Anion gap: 11 (ref 5–15)
BUN: 7 mg/dL (ref 6–20)
CO2: 25 mmol/L (ref 22–32)
Calcium: 9.1 mg/dL (ref 8.9–10.3)
Chloride: 102 mmol/L (ref 98–111)
Creatinine, Ser: 0.61 mg/dL (ref 0.44–1.00)
GFR, Estimated: >60 mL/min (ref >60)
Glucose, Bld: 102 mg/dL — ABNORMAL HIGH (ref 70–99)
Potassium: 3.5 mmol/L (ref 3.5–5.1)
Sodium: 138 mmol/L (ref 135–145)

## 2023-04-25 LAB — CBG MONITORING, ED: Glucose-Capillary: 102 mg/dL — ABNORMAL HIGH (ref 70–99)

## 2023-04-25 MED ORDER — ACETAMINOPHEN 325 MG PO TABS
650.0000 mg | ORAL_TABLET | Freq: Once | ORAL | Status: AC
  Administered 2023-04-25: 650 mg via ORAL
  Filled 2023-04-25: qty 2

## 2023-04-25 MED ORDER — SODIUM CHLORIDE 0.9 % IV BOLUS
2000.0000 mL | Freq: Once | INTRAVENOUS | Status: AC
  Administered 2023-04-25: 2000 mL via INTRAVENOUS

## 2023-04-25 MED ORDER — ONDANSETRON HCL 4 MG/2ML IJ SOLN
4.0000 mg | Freq: Once | INTRAMUSCULAR | Status: AC
  Administered 2023-04-25: 4 mg via INTRAVENOUS
  Filled 2023-04-25: qty 2

## 2023-04-25 MED ORDER — IOHEXOL 300 MG/ML  SOLN
100.0000 mL | Freq: Once | INTRAMUSCULAR | Status: AC | PRN
  Administered 2023-04-25: 100 mL via INTRAVENOUS

## 2023-04-25 MED ORDER — LACTATED RINGERS IV BOLUS
2000.0000 mL | Freq: Once | INTRAVENOUS | Status: DC
Start: 2023-04-25 — End: 2023-04-25

## 2023-04-25 NOTE — Telephone Encounter (Signed)
 Routing to office

## 2023-04-25 NOTE — ED Triage Notes (Signed)
 Pt presents with poor appetite that has been ongoing.  Has had dizziness for 2 days.  Family encouraged her to come to the ED. Has been seen for same recently.  Reports a new medication, zoloft, that she has taken 3 times but states it has made her feel worse. Continued severe weight loss per family.

## 2023-04-25 NOTE — Telephone Encounter (Signed)
 Triaged by another RN.   Copied from CRM [id number]. Topic: Clinical - Pink Word Triage >> Apr 25, 2023  3:26 PM [name] wrote: Reason for Triage: Patient is calling in because she was prescribed  sertraline (ZOLOFT) 50 MG tablet [[id number]] and patient says she has been feeling dizzy as well as not being able to eat or sleep. Patient says she doesn't know if it's from the medication but she doesn't like how she feels and would like advice on what to do.

## 2023-04-25 NOTE — Telephone Encounter (Signed)
 Reason for Disposition . [1] Intentional drug overdose AND [2] suicidal thoughts or ideas  Answer Assessment - Initial Assessment Questions 1. NAME of MEDICINE: "What medicine(s) are you calling about?"     Zoloft 2. QUESTION: "What is your question?" (e.g., double dose of medicine, side effect)     4 days after starting medication started to become dizzy and not able to sleep or eat. 3. PRESCRIBER: "Who prescribed the medicine?" Reason: if prescribed by specialist, call should be referred to that group.     Dr. Andrey Campanile 4. SYMPTOMS: "Do you have any symptoms?" If Yes, ask: "What symptoms are you having?"  "How bad are the symptoms (e.g., mild, moderate, severe)     denies 5. PREGNANCY:  "Is there any chance that you are pregnant?" "When was your last menstrual period?"     Denies.  Protocols used: Medication Question Call-A-AH

## 2023-04-25 NOTE — Telephone Encounter (Signed)
 Copied From CRM [id number]. Reason for Triage: Patient is calling in because she was prescribed  sertraline (ZOLOFT) 50 MG tablet [[id number]] and patient says she has been feeling dizzy as well as not being able to eat or sleep. Patient says she doesn't know if it's from the medication but she doesn't like how she feels and would like advice on what to do.    Chief Complaint: dizziness four days after taking zoloft.   Symptoms: dizzy, passed out, heart racing, unable to sleep, unable to eat, cannot get out of bed Frequency: constant Pertinent Negatives: Patient denies fever, uri symptoms, ear ache Disposition: [x] ED /[] Urgent Care (no appt availability in office) / [] Appointment(In office/virtual)/ []  [place] [facility]/ [] Home Care/ [] Refused Recommended Disposition /[] [facility] [facility]/ []  Follow-up with PCP Additional Notes: Per protocol instructed to go to ER for evalu.  Care advice given, denies questions, and hung up prior to care advice being given.  Pcp office updated.

## 2023-04-25 NOTE — Discharge Instructions (Addendum)
 Your workup in the emergency department was reassuring.  Blood work did not show any concerning findings.  CT scan did not show any worrisome findings either.  Please follow-up with your primary care provider.  Please also follow-up with gastroenterology.  If you are unable to follow-up with [facility] gastroenterology sooner have given you referral to [facility] gastroenterology.  For any concerning symptoms return to the emergency room.

## 2023-04-25 NOTE — Telephone Encounter (Signed)
 Patient was seen in ED on 04/25/2023

## 2023-04-25 NOTE — ED Notes (Signed)
 X-ray at bedside

## 2023-04-25 NOTE — ED Provider Notes (Signed)
  EMERGENCY DEPARTMENT AT [facility] Provider Note   CSN: [id number] Arrival date & time: 04/25/23  1713     History  Chief Complaint  Patient presents with   Near Syncope    Kristin Hickman is a 53 y.o. female.  53 year old female presents today for concern of not feeling well for 5 days.  She states 5 days ago she started Zoloft.  She states minimal p.o. intake.  She states however that she has been feeling like this since October.  She was evaluated in the emergency department and had extensive workup done.  She was ultimately discharged.  She did not follow-up outpatient until recently.  She has been given referrals to gastroenterology, as well as neurology but these are not for another few weeks.  She was finally convinced by her family to come into the emergency department evaluation.  The history is provided by the patient. No language interpreter was used.       Home Medications Prior to Admission medications   Medication Sig Start Date End Date Taking? Authorizing Provider  acamprosate (CAMPRAL) 333 MG tablet Take 666 mg by mouth 3 (three) times daily. Patient not taking: Reported on 04/15/2023 11/30/22   [provider]  amoxicillin (AMOXIL) 250 MG capsule Take by mouth. Patient not taking: Reported on 04/15/2023 12/05/22   [provider]  buprenorphine (SUBUTEX) 8 MG SUBL SL tablet Place 2.5 tablets under the tongue daily. Patient not taking: Reported on 04/15/2023 12/06/22   [provider]  dicyclomine (BENTYL) 20 MG tablet Take 20 mg by mouth every 8 (eight) hours as needed. Patient not taking: Reported on 04/15/2023 12/06/22   [provider]  EYSUVIS 0.25 % SUSP Apply 1 drop to eye 4 (four) times daily. Patient not taking: Reported on 04/15/2023 11/14/22   [provider]  gabapentin (NEURONTIN) 300 MG capsule Take 300 mg by mouth 3 (three) times daily as needed. Patient not taking: Reported on 04/15/2023  12/06/22   [provider]  hydrOXYzine (VISTARIL) 50 MG capsule Take 50 mg by mouth every 6 (six) hours as needed. Patient not taking: Reported on 04/15/2023 12/06/22   [provider]  ondansetron (ZOFRAN) 4 MG tablet Take 1 tablet (4 mg total) by mouth every 6 (six) hours. Patient not taking: Reported on 04/15/2023 12/11/22   Glyn Ade, MD  ondansetron (ZOFRAN-ODT) 4 MG disintegrating tablet Take 4 mg by mouth 2 (two) times daily as needed. Patient not taking: Reported on 04/15/2023 12/06/22   [provider]  pantoprazole (PROTONIX) 20 MG tablet Take 1 tablet (20 mg total) by mouth daily. 12/11/22 01/10/23  Glyn Ade, MD  RESTASIS 0.05 % ophthalmic emulsion Place 1 drop into both eyes 2 (two) times daily. Patient not taking: Reported on 04/15/2023 11/14/22   [provider]  sertraline (ZOLOFT) 50 MG tablet Take 1 tablet (50 mg total) by mouth daily. 04/15/23   Georganna Skeans, MD      Allergies    Codeine    Review of Systems   Review of Systems  Constitutional:  Positive for appetite change and fatigue. Negative for chills and fever.  Respiratory:  Negative for cough and shortness of breath.   Cardiovascular:  Negative for chest pain.  Gastrointestinal:  Negative for abdominal pain, nausea and vomiting.  Neurological:  Positive for light-headedness.    Physical Exam Updated Vital Signs BP (!) 146/92 (BP Location: Right Arm)   Pulse 78   Temp 98.9 F (37.2  C) (Oral)   Resp 14   SpO2 100%  Physical Exam Vitals and nursing note reviewed.  Constitutional:      General: She is not in acute distress.    Appearance: Normal appearance. She is ill-appearing (Chronically ill-appearing).  HENT:     Head: Normocephalic and atraumatic.     Nose: Nose normal.  Eyes:     General: No scleral icterus.    Extraocular Movements: Extraocular movements intact.     Conjunctiva/sclera: Conjunctivae normal.  Cardiovascular:     Rate and Rhythm:  Normal rate and regular rhythm.  Pulmonary:     Effort: Pulmonary effort is normal. No respiratory distress.     Breath sounds: Normal breath sounds. No wheezing or rales.  Abdominal:     General: There is no distension.     Tenderness: There is no abdominal tenderness. There is no guarding.  Musculoskeletal:        General: Normal range of motion.     Cervical back: Normal range of motion.  Skin:    General: Skin is warm and dry.  Neurological:     General: No focal deficit present.     Mental Status: She is alert. Mental status is at baseline.     ED Results / Procedures / Treatments   Labs (all labs ordered are listed, but only abnormal results are displayed) Labs Reviewed  BASIC METABOLIC PANEL - Abnormal; Notable for the following components:      Result Value   Glucose, Bld 102 (*)    All other components within normal limits  CBC - Abnormal; Notable for the following components:   Hemoglobin 15.2 (*)    All other components within normal limits  CBG MONITORING, ED - Abnormal; Notable for the following components:   Glucose-Capillary 102 (*)    All other components within normal limits  HCG, SERUM, QUALITATIVE  URINALYSIS, ROUTINE W REFLEX MICROSCOPIC    EKG None  Radiology DG Chest Portable 1 View Result Date: 04/25/2023 CLINICAL DATA:  Syncope, weakness EXAM: PORTABLE CHEST 1 VIEW COMPARISON:  09/24/2015 FINDINGS: The heart size and mediastinal contours are within normal limits. Both lungs are clear. The visualized skeletal structures are unremarkable. IMPRESSION: No active cardiopulmonary disease. Electronically Signed   By: Charlett Nose M.D.   On: 04/25/2023 20:48    Procedures Procedures    Medications Ordered in ED Medications  acetaminophen (TYLENOL) tablet 650 mg (has no administration in time range)  ondansetron (ZOFRAN) injection 4 mg (4 mg Intravenous Given 04/25/23 2056)  sodium chloride 0.9 % bolus 2,000 mL (2,000 mLs Intravenous New Bag/Given  04/25/23 2102)  iohexol (OMNIPAQUE) 300 MG/ML solution 100 mL (100 mLs Intravenous Contrast Given 04/25/23 2113)    ED Course/ Medical Decision Making/ A&P                                 Medical Decision Making Amount and/or Complexity of Data Reviewed Labs: ordered. Radiology: ordered.  Risk OTC drugs. Prescription drug management.   Medical Decision Making / ED Course   This patient presents to the ED for concern of loss of appetite, fatigue, this involves an extensive number of treatment options, and is a complaint that carries with it a high risk of complications and morbidity.  The differential diagnosis includes failure to thrive, side effect from recent medication Zoloft, dehydration, orthostasis  MDM: 53 year old female presents today for evaluation of above-mentioned complaints.  She is clinically stable.  Does appear older than stated age.  No abdominal tenderness to palpation.  Lung sounds are clear. Recently started Zoloft.  She states she may be having side effects to this medication.  She states over the past couple days she has not taken this due to concern for side effects.  Decreased p.o. intake.  States she is not able to keep anything down not because she throws it up just because she does not feel the need to eat.  She was recently seen by PCP.  Given referral for gastroenterology as well as neurology. Workup reviewed from October 8th visit.  She had CT chest abdomen pelvis done at that time which showed lymph nodes/nodules which were recommended to be followed up in about 1 year.  Otherwise no acute concern.  Workup today overall reassuring.  CBC without leukocytosis or anemia.  BMP without acute concern.  Pregnancy test negative.  Chest x-ray without acute cardiopulmonary process.  EKG without acute ischemic change.  Due to previous CT repeat this today to ensure there is no malignancy or other acute concerns leading to patient significant weight loss, weakness, loss  of appetite.  CT chest abdomen pelvis with contrast resulted and shows no acute cardiopulmonary process or acute intra-abdominal process.  There is some cholelithiasis however no evidence of cholecystitis.  She is without any abdominal pain or abdominal tenderness.  Low suspicion for biliary colic.  She is appropriate for discharge.  She will follow-up with PCP and gastroenterology.  Will give [facility] gastroenterology referral.  Patient and family voiced understanding and are in agreement with plan.   Additional history obtained: -Additional history obtained from previous visit. -External records from outside source obtained and reviewed including: Chart review including previous notes, labs, imaging, consultation notes   Lab Tests: -I ordered, reviewed, and interpreted labs.   The pertinent results include:   Labs Reviewed  BASIC METABOLIC PANEL - Abnormal; Notable for the following components:      Result Value   Glucose, Bld 102 (*)    All other components within normal limits  CBC - Abnormal; Notable for the following components:   Hemoglobin 15.2 (*)    All other components within normal limits  CBG MONITORING, ED - Abnormal; Notable for the following components:   Glucose-Capillary 102 (*)    All other components within normal limits  HCG, SERUM, QUALITATIVE  URINALYSIS, ROUTINE W REFLEX MICROSCOPIC      EKG  EKG Interpretation Date/Time:    Ventricular Rate:    PR Interval:    QRS Duration:    QT Interval:    QTC Calculation:   R Axis:      Text Interpretation:           Imaging Studies ordered: I ordered imaging studies including chest x-ray, CT chest abdomen pelvis with contrast I independently visualized and interpreted imaging. I agree with the radiologist interpretation   Medicines ordered and prescription drug management: Meds ordered this encounter  Medications   DISCONTD: lactated ringers bolus 2,000 mL   ondansetron (ZOFRAN) injection 4 mg    sodium chloride 0.9 % bolus 2,000 mL   acetaminophen (TYLENOL) tablet 650 mg   iohexol (OMNIPAQUE) 300 MG/ML solution 100 mL    -I have reviewed the patients home medicines and have made adjustments as needed   Cardiac Monitoring: The patient was maintained on a cardiac monitor.  I personally viewed and interpreted the cardiac monitored which showed an underlying rhythm of: sinus  rhythm  Reevaluation: After the interventions noted above, I reevaluated the patient and found that they have :stayed the same  Co morbidities that complicate the patient evaluation  Past Medical History:  Diagnosis Date   Addison disease (HCC)    Hypertension       Dispostion: Discharged in stable condition.  Return precaution discussed.  She will follow-up with her PCP as well as gastroenterology.    Final Clinical Impression(s) / ED Diagnoses Final diagnoses:  Lightheadedness  Loss of appetite    Rx / DC Orders ED Discharge Orders     None         Marita Kansas, PA-C 04/25/23 2153    Royanne Foots, DO 04/28/23 1148

## 2023-05-16 ENCOUNTER — Encounter: Payer: Self-pay | Admitting: Family Medicine

## 2023-05-16 ENCOUNTER — Ambulatory Visit: Payer: Commercial Managed Care - HMO | Admitting: Family Medicine

## 2023-05-30 ENCOUNTER — Ambulatory Visit: Payer: Commercial Managed Care - HMO | Admitting: Physician Assistant

## 2023-06-03 ENCOUNTER — Ambulatory Visit: Admitting: Internal Medicine

## 2023-10-12 ENCOUNTER — Other Ambulatory Visit: Payer: Self-pay

## 2023-10-12 ENCOUNTER — Encounter (HOSPITAL_BASED_OUTPATIENT_CLINIC_OR_DEPARTMENT_OTHER): Payer: Self-pay

## 2023-10-12 ENCOUNTER — Emergency Department (HOSPITAL_BASED_OUTPATIENT_CLINIC_OR_DEPARTMENT_OTHER)

## 2023-10-12 ENCOUNTER — Emergency Department (HOSPITAL_BASED_OUTPATIENT_CLINIC_OR_DEPARTMENT_OTHER)
Admission: EM | Admit: 2023-10-12 | Discharge: 2023-10-12 | Disposition: A | Discharge status: Home / Self Care | Attending: Emergency Medicine | Admitting: Emergency Medicine

## 2023-10-12 DIAGNOSIS — I10 Essential (primary) hypertension: Secondary | ICD-10-CM | POA: Diagnosis not present

## 2023-10-12 DIAGNOSIS — E119 Type 2 diabetes mellitus without complications: Secondary | ICD-10-CM | POA: Diagnosis not present

## 2023-10-12 DIAGNOSIS — F17210 Nicotine dependence, cigarettes, uncomplicated: Secondary | ICD-10-CM | POA: Diagnosis not present

## 2023-10-12 DIAGNOSIS — J45909 Unspecified asthma, uncomplicated: Secondary | ICD-10-CM | POA: Diagnosis not present

## 2023-10-12 DIAGNOSIS — Z79899 Other long term (current) drug therapy: Secondary | ICD-10-CM | POA: Insufficient documentation

## 2023-10-12 DIAGNOSIS — R0602 Shortness of breath: Secondary | ICD-10-CM | POA: Diagnosis present

## 2023-10-12 DIAGNOSIS — Z7984 Long term (current) use of oral hypoglycemic drugs: Secondary | ICD-10-CM | POA: Diagnosis not present

## 2023-10-12 DIAGNOSIS — U071 COVID-19: Secondary | ICD-10-CM | POA: Insufficient documentation

## 2023-10-12 HISTORY — DX: Unspecified asthma, uncomplicated: J45.909

## 2023-10-12 HISTORY — DX: Type 2 diabetes mellitus without complications: E11.9

## 2023-10-12 LAB — BASIC METABOLIC PANEL WITH GFR
Anion gap: 16 — ABNORMAL HIGH (ref 5–15)
BUN: <5 mg/dL — ABNORMAL LOW (ref 6–20)
CO2: 21 mmol/L — ABNORMAL LOW (ref 22–32)
Calcium: 9.9 mg/dL (ref 8.9–10.3)
Chloride: 103 mmol/L (ref 98–111)
Creatinine, Ser: 0.7 mg/dL (ref 0.44–1.00)
GFR, Estimated: >60 mL/min (ref >60)
Glucose, Bld: 107 mg/dL — ABNORMAL HIGH (ref 70–99)
Potassium: 3.7 mmol/L (ref 3.5–5.1)
Sodium: 140 mmol/L (ref 135–145)

## 2023-10-12 LAB — TROPONIN T, HIGH SENSITIVITY: Troponin T High Sensitivity: <15 ng/L (ref <19)

## 2023-10-12 LAB — CBC
HCT: 44.8 % (ref 36.0–46.0)
Hemoglobin: 15.4 g/dL — ABNORMAL HIGH (ref 12.0–15.0)
MCH: 33.5 pg (ref 26.0–34.0)
MCHC: 34.4 g/dL (ref 30.0–36.0)
MCV: 97.4 fL (ref 80.0–100.0)
Platelets: 279 K/uL (ref 150–400)
RBC: 4.6 MIL/uL (ref 3.87–5.11)
RDW: 12.7 % (ref 11.5–15.5)
WBC: 7 K/uL (ref 4.0–10.5)
nRBC: 0 % (ref 0.0–0.2)

## 2023-10-12 LAB — RESP PANEL BY RT-PCR (RSV, FLU A&B, COVID)  RVPGX2
Influenza A by PCR: NEGATIVE
Influenza B by PCR: NEGATIVE
Resp Syncytial Virus by PCR: NEGATIVE
SARS Coronavirus 2 by RT PCR: POSITIVE — AB

## 2023-10-12 MED ORDER — ALBUTEROL SULFATE (2.5 MG/3ML) 0.083% IN NEBU
2.5000 mg | INHALATION_SOLUTION | Freq: Once | RESPIRATORY_TRACT | Status: AC
  Administered 2023-10-12: 2.5 mg via RESPIRATORY_TRACT

## 2023-10-12 MED ORDER — IPRATROPIUM-ALBUTEROL 0.5-2.5 (3) MG/3ML IN SOLN: 3.0000 mL | Freq: Once | RESPIRATORY_TRACT | Status: AC

## 2023-10-12 MED ORDER — IPRATROPIUM-ALBUTEROL 0.5-2.5 (3) MG/3ML IN SOLN
RESPIRATORY_TRACT | Status: AC
  Administered 2023-10-12: 3 mL via RESPIRATORY_TRACT
  Filled 2023-10-12: qty 3

## 2023-10-12 MED ORDER — PAXLOVID (300/100) 20 X 150 MG & 10 X 100MG PO TBPK: 3.0000 | ORAL_TABLET | Freq: Two times a day (BID) | ORAL | 0 refills | Status: AC

## 2023-10-12 MED ORDER — ALBUTEROL SULFATE (2.5 MG/3ML) 0.083% IN NEBU
INHALATION_SOLUTION | RESPIRATORY_TRACT | Status: AC
  Administered 2023-10-12: 2.5 mg via RESPIRATORY_TRACT
  Filled 2023-10-12: qty 3

## 2023-10-12 MED ORDER — ALBUTEROL SULFATE HFA 108 (90 BASE) MCG/ACT IN AERS: 1.0000 | INHALATION_SPRAY | Freq: Four times a day (QID) | RESPIRATORY_TRACT | 0 refills | Status: AC | PRN

## 2023-10-12 MED ORDER — ALBUTEROL SULFATE (2.5 MG/3ML) 0.083% IN NEBU: 2.5000 mg | INHALATION_SOLUTION | Freq: Once | RESPIRATORY_TRACT | Status: AC

## 2023-10-12 MED ORDER — METHYLPREDNISOLONE SODIUM SUCC 125 MG IJ SOLR
125.0000 mg | Freq: Once | INTRAMUSCULAR | Status: AC
  Administered 2023-10-12: 125 mg via INTRAVENOUS
  Filled 2023-10-12: qty 2

## 2023-10-12 MED ORDER — IOHEXOL 350 MG/ML SOLN
75.0000 mL | Freq: Once | INTRAVENOUS | Status: AC | PRN
  Administered 2023-10-12: 75 mL via INTRAVENOUS

## 2023-10-12 NOTE — ED Provider Notes (Signed)
 [facility] EMERGENCY DEPARTMENT AT [facility] Provider Note  CSN: [id number] Arrival date & time: 10/12/23 1757  Chief Complaint(s) Shortness of Breath and Chest Pain  HPI Kristin Hickman is a 53 y.o. female who is here today for shortness of breath.  Patient tells me that has a past history of Addison's disease, does not take steroids.  She says that she has felt very short of breath for the last several days.  Patient does not regularly follow with a PCP.  She smokes 1 pack/day for as long as she can remember.  She denies any fever or chills.   Past Medical History Past Medical History:  Diagnosis Date   Addison disease (HCC)    Asthma    Diabetes mellitus without complication (HCC)    Hypertension    There are no active problems to display for this patient.  Home Medication(s) Prior to Admission medications   Medication Sig Start Date End Date Taking? Authorizing Provider  albuterol  (VENTOLIN  HFA) 108 (90 Base) MCG/ACT inhaler Inhale 1-2 puffs into the lungs every 6 (six) hours as needed for wheezing or shortness of breath. 10/12/23  Yes Mannie Fairy DASEN, DO  nirmatrelvir/ritonavir (PAXLOVID , 300/100,) 20 x 150 MG & 10 x 100MG  TBPK Take 3 tablets by mouth 2 (two) times daily for 5 days. Patient GFR is 60. Take nirmatrelvir (150 mg) two tablets twice daily for 5 days and ritonavir (100 mg) one tablet twice daily for 5 days. 10/12/23 10/17/23 Yes Mannie Fairy DASEN, DO  acamprosate (CAMPRAL) 333 MG tablet Take 666 mg by mouth 3 (three) times daily. Patient not taking: Reported on 04/15/2023 11/30/22   [provider]  amoxicillin  (AMOXIL ) 250 MG capsule Take by mouth. Patient not taking: Reported on 04/15/2023 12/05/22   [provider]  buprenorphine (SUBUTEX) 8 MG SUBL SL tablet Place 2.5 tablets under the tongue daily. Patient not taking: Reported on 04/15/2023 12/06/22   [provider]  dicyclomine  (BENTYL ) 20 MG tablet Take 20 mg by mouth every 8  (eight) hours as needed. Patient not taking: Reported on 04/15/2023 12/06/22   [provider]  EYSUVIS 0.25 % SUSP Apply 1 drop to eye 4 (four) times daily. Patient not taking: Reported on 04/15/2023 11/14/22   [provider]  gabapentin (NEURONTIN) 300 MG capsule Take 300 mg by mouth 3 (three) times daily as needed. Patient not taking: Reported on 04/15/2023 12/06/22   [provider]  hydrOXYzine (VISTARIL) 50 MG capsule Take 50 mg by mouth every 6 (six) hours as needed. Patient not taking: Reported on 04/15/2023 12/06/22   [provider]  ondansetron  (ZOFRAN ) 4 MG tablet Take 1 tablet (4 mg total) by mouth every 6 (six) hours. Patient not taking: Reported on 04/15/2023 12/11/22   Jerral Meth, MD  ondansetron  (ZOFRAN -ODT) 4 MG disintegrating tablet Take 4 mg by mouth 2 (two) times daily as needed. Patient not taking: Reported on 04/15/2023 12/06/22   [provider]  pantoprazole  (PROTONIX ) 20 MG tablet Take 1 tablet (20 mg total) by mouth daily. 12/11/22 01/10/23  Jerral Meth, MD  RESTASIS 0.05 % ophthalmic emulsion Place 1 drop into both eyes 2 (two) times daily. Patient not taking: Reported on 04/15/2023 11/14/22   [provider]  sertraline  (ZOLOFT ) 50 MG tablet Take 1 tablet (50 mg total) by mouth daily. 04/15/23   Tanda Bleacher, MD  Past Surgical History Past Surgical History:  Procedure Laterality Date   BACK SURGERY     saliva gland tumor removal Right    Family History History reviewed. No pertinent family history.  Social History Social History   Tobacco Use   Smoking status: Every Day    Current packs/day: 1.00    Types: Cigarettes   Smokeless tobacco: Never  Vaping Use   Vaping status: Never Used  Substance Use Topics   Alcohol use: Yes    Alcohol/week: 7.0 standard drinks of alcohol     Types: 7 Cans of beer per week   Drug use: No   Allergies Codeine  Review of Systems Review of Systems  Physical Exam Vital Signs  I have reviewed the triage vital signs BP (!) 133/94 (BP Location: Right Arm)   Pulse (!) 109   Temp 98 F (36.7 C) (Oral)   Resp 16   Ht 5' 7 (1.702 m)   Wt 45.4 kg   SpO2 100%   BMI 15.66 kg/m   Physical Exam Vitals and nursing note reviewed.  HENT:     Head: Normocephalic and atraumatic.  Cardiovascular:     Rate and Rhythm: Normal rate.  Pulmonary:     Effort: Pulmonary effort is normal.     Breath sounds: No decreased breath sounds, wheezing or rales.  Chest:     Chest wall: No mass.  Abdominal:     Palpations: Abdomen is soft.  Musculoskeletal:        General: Normal range of motion.     Cervical back: Normal range of motion.  Skin:    General: Skin is warm.  Neurological:     Mental Status: She is alert.     ED Results and Treatments Labs (all labs ordered are listed, but only abnormal results are displayed) Labs Reviewed  RESP PANEL BY RT-PCR (RSV, FLU A&B, COVID)  RVPGX2 - Abnormal; Notable for the following components:      Result Value   SARS Coronavirus 2 by RT PCR POSITIVE (*)    All other components within normal limits  BASIC METABOLIC PANEL WITH GFR - Abnormal; Notable for the following components:   CO2 21 (*)    Glucose, Bld 107 (*)    BUN <5 (*)    Anion gap 16 (*)    All other components within normal limits  CBC - Abnormal; Notable for the following components:   Hemoglobin 15.4 (*)    All other components within normal limits  LIPASE, BLOOD  HEPATIC FUNCTION PANEL  TROPONIN T, HIGH SENSITIVITY                                                                                                                          Radiology CT Angio Chest/Abd/Pel for Dissection W and/or W/WO Result Date: 10/12/2023 CLINICAL DATA:  Acute aortic syndrome (AAS) suspected EXAM: CT ANGIOGRAPHY CHEST, ABDOMEN AND  PELVIS TECHNIQUE: Non-contrast CT of the chest was initially  obtained. Multidetector CT imaging through the chest, abdomen and pelvis was performed using the standard protocol during bolus administration of intravenous contrast. Multiplanar reconstructed images and MIPs were obtained and reviewed to evaluate the vascular anatomy. RADIATION DOSE REDUCTION: This exam was performed according to the departmental dose-optimization program which includes automated exposure control, adjustment of the mA and/or kV according to patient size and/or use of iterative reconstruction technique. CONTRAST:  75mL OMNIPAQUE  IOHEXOL  350 MG/ML SOLN COMPARISON:  April 25, 2023 FINDINGS: CTA CHEST FINDINGS Pulmonary Embolism: No pulmonary embolism. Cardiovascular: No cardiomegaly or pericardial effusion. No aortic aneurysm, intramural hematoma, or aortic dissection. Mediastinum/Nodes: No mediastinal mass. No mediastinal, hilar, or axillary lymphadenopathy. Lungs/Pleura: The midline trachea and bronchi are patent. Biapical pleuroparenchymal scarring. Mild centrilobular emphysema. No focal airspace consolidation, pleural effusion, or pneumothorax. A couple of scattered calcified granulomas. Musculoskeletal: No acute fracture or destructive bone lesion. Review of the MIP images confirms the above findings. CTA ABDOMEN AND PELVIS FINDINGS VASCULAR Aorta: No aortic aneurysm or aortic dissection. Scattered calcified atherosclerosis in the infrarenal aorta. No hemodynamically significant stenosis. Celiac: Patent without acute thrombus, aneurysm, or dissection. No hemodynamically significant stenosis. SMA: Patent without acute thrombus, aneurysm, or dissection. No hemodynamically significant stenosis. Renals: On the right, there are 2 renal arteries with a single left renal artery. Patent without acute thrombus, aneurysm, or dissection. No hemodynamically significant stenosis. IMA: Patent without acute thrombus, aneurysm, or dissection. No  hemodynamically significant stenosis. Inflow: Patent without acute thrombus, aneurysm, or dissection. No hemodynamically significant stenosis. Proximal Outflow: The bilateral common femoral and visualized portions of the superficial and profunda femoral arteries are patent without acute thrombus, aneurysm, or dissection. No hemodynamically significant stenosis. Veins: Nondiagnostic evaluation of the veins due to arterial timing of the contrast bolus. Review of the MIP images confirms the above findings. NON-VASCULAR Hepatobiliary: No mass.Small layering radiopaque gallstones. No intrahepatic or extrahepatic biliary ductal dilation. Pancreas: No mass or main ductal dilation.No peripancreatic inflammation or fluid collection. Spleen: Normal size. No mass. Adrenals/Urinary Tract: No adrenal masses. No renal mass. No hydronephrosis or nephrolithiasis. The urinary bladder is distended without focal abnormality. Stomach/Bowel: The stomach is decompressed without focal abnormality. No small bowel wall thickening or inflammation. No small bowel obstruction.Normal appendix. Lymphatic: No intraabdominal or pelvic lymphadenopathy. Reproductive: The uterus and ovaries are within normal limits for patient's age. No free pelvic fluid. Other: No pneumoperitoneum, ascites, or mesenteric inflammation. Musculoskeletal: No acute fracture or destructive lesion.Multilevel thoracic osteophytosis. Moderate degenerative disc disease at L4-L5. Review of the MIP images confirms the above findings. IMPRESSION: 1. No aortic aneurysm, intramural hematoma, or aortic dissection. 2. Small layering radiopaque gallstones. No changes of acute cholecystitis. 3. Mild centrilobular emphysema. No pneumonia, pulmonary edema, or pleural effusion. Aortic Atherosclerosis (ICD10-I70.0) and Emphysema (ICD10-J43.9). Electronically Signed   By: Rogelia Myers M.D.   On: 10/12/2023 19:28    Pertinent labs & imaging results that were available during my care  of the patient were reviewed by me and considered in my medical decision making (see MDM for details).  Medications Ordered in ED Medications  ipratropium-albuterol  (DUONEB) 0.5-2.5 (3) MG/3ML nebulizer solution 3 mL (3 mLs Nebulization Given 10/12/23 1814)  albuterol  (PROVENTIL ) (2.5 MG/3ML) 0.083% nebulizer solution 2.5 mg (2.5 mg Nebulization Given 10/12/23 1813)  ipratropium-albuterol  (DUONEB) 0.5-2.5 (3) MG/3ML nebulizer solution 3 mL (3 mLs Nebulization Given 10/12/23 1821)  albuterol  (PROVENTIL ) (2.5 MG/3ML) 0.083% nebulizer solution 2.5 mg (2.5 mg Nebulization Given 10/12/23 1821)  methylPREDNISolone  sodium succinate (SOLU-MEDROL ) 125 mg/2 mL injection 125 mg (  125 mg Intravenous Given 10/12/23 1933)  iohexol  (OMNIPAQUE ) 350 MG/ML injection 75 mL (75 mLs Intravenous Contrast Given 10/12/23 1843)                                                                                                                                     Procedures Procedures  (including critical care time)  Medical Decision Making / ED Course   This patient presents to the ED for concern of shortness of breath, this involves an extensive number of treatment options, and is a complaint that carries with it a high risk of complications and morbidity.  The differential diagnosis includes COPD exacerbation, pleurisy, pneumonia, viral syndrome, considered PE, aortic dissection.  MDM: Respiratory therapy provided patient with breathing treatment prior to my assessment.  They reported that the patient was severely diminished prior to her breathing treatment, upon my assessment, patient with clear lungs, no wheezing.  Patient reports pain in her chest with radiation to her back.  Will obtain CTA.  Troponin ordered.  Patient initial EKG shows subtle ST changes, heart rate elevated to 110.  Reassessed p.m.-patient's CTA negative.  She did also defer breathing 100% on room air.  Will discharge patient with a prescription for Paxlovid ,  and albuterol  inhaler.  Will have her follow-up with her PCP.   Additional history obtained: -Additional history obtained from family at bedside -External records from outside source obtained and reviewed including: Chart review including previous notes, labs, imaging, consultation notes   Lab Tests: -I ordered, reviewed, and interpreted labs.   The pertinent results include:   Labs Reviewed  RESP PANEL BY RT-PCR (RSV, FLU A&B, COVID)  RVPGX2 - Abnormal; Notable for the following components:      Result Value   SARS Coronavirus 2 by RT PCR POSITIVE (*)    All other components within normal limits  BASIC METABOLIC PANEL WITH GFR - Abnormal; Notable for the following components:   CO2 21 (*)    Glucose, Bld 107 (*)    BUN <5 (*)    Anion gap 16 (*)    All other components within normal limits  CBC - Abnormal; Notable for the following components:   Hemoglobin 15.4 (*)    All other components within normal limits  LIPASE, BLOOD  HEPATIC FUNCTION PANEL  TROPONIN T, HIGH SENSITIVITY      EKG sinus tachycardia, no acute ischemia.  EKG Interpretation Date/Time:  Saturday October 12 2023 18:10:04 EDT Ventricular Rate:  110 PR Interval:  173 QRS Duration:  92 QT Interval:  343 QTC Calculation: 464 R Axis:   88  Text Interpretation: Sinus tachycardia LAE, consider biatrial enlargement Probable left ventricular hypertrophy ST elevation, consider inferior injury Confirmed by Mannie Pac [phone]) on 10/12/2023 6:31:20 PM         Imaging Studies ordered: I ordered imaging studies including CTA chest I independently visualized and interpreted imaging. I agree with the  radiologist interpretation   Medicines ordered and prescription drug management: Meds ordered this encounter  Medications   ipratropium-albuterol  (DUONEB) 0.5-2.5 (3) MG/3ML nebulizer solution 3 mL   albuterol  (PROVENTIL ) (2.5 MG/3ML) 0.083% nebulizer solution 2.5 mg   ipratropium-albuterol  (DUONEB) 0.5-2.5  (3) MG/3ML nebulizer solution 3 mL   ipratropium-albuterol  (DUONEB) 0.5-2.5 (3) MG/3ML nebulizer solution    Powell, Robin F: cabinet override   albuterol  (PROVENTIL ) (2.5 MG/3ML) 0.083% nebulizer solution    Perri, Robin F: cabinet override   albuterol  (PROVENTIL ) (2.5 MG/3ML) 0.083% nebulizer solution 2.5 mg   ipratropium-albuterol  (DUONEB) 0.5-2.5 (3) MG/3ML nebulizer solution    Powell, Robin F: cabinet override   albuterol  (PROVENTIL ) (2.5 MG/3ML) 0.083% nebulizer solution    Perri Rima F: cabinet override   methylPREDNISolone  sodium succinate (SOLU-MEDROL ) 125 mg/2 mL injection 125 mg    IV methylprednisolone  will be converted to either a q12h or q24h frequency with the same total daily dose (TDD).  Ordered Dose: 1 to 125 mg TDD; convert to: TDD q24h.  Ordered Dose: 126 to 250 mg TDD; convert to: TDD div q12h.  Ordered Dose: >250 mg TDD; DAW.   iohexol  (OMNIPAQUE ) 350 MG/ML injection 75 mL   albuterol  (VENTOLIN  HFA) 108 (90 Base) MCG/ACT inhaler    Sig: Inhale 1-2 puffs into the lungs every 6 (six) hours as needed for wheezing or shortness of breath.    Dispense:  6.7 g    Refill:  0   nirmatrelvir/ritonavir (PAXLOVID , 300/100,) 20 x 150 MG & 10 x 100MG  TBPK    Sig: Take 3 tablets by mouth 2 (two) times daily for 5 days. Patient GFR is 60. Take nirmatrelvir (150 mg) two tablets twice daily for 5 days and ritonavir (100 mg) one tablet twice daily for 5 days.    Dispense:  30 tablet    Refill:  0    -I have reviewed the patients home medicines and have made adjustments as needed     Cardiac Monitoring: The patient was maintained on a cardiac monitor.  I personally viewed and interpreted the cardiac monitored which showed an underlying rhythm of: Normal sinus rhythm  Social Determinants of Health:  Factors impacting patients care include: Lack of access to primary care   Reevaluation: After the interventions noted above, I reevaluated the patient and found that they have  :improved  Co morbidities that complicate the patient evaluation  Past Medical History:  Diagnosis Date   Addison disease (HCC)    Asthma    Diabetes mellitus without complication (HCC)    Hypertension       Dispostion: I considered admission for this patient, however with her reassuring vital signs she is appropriate for discharge.     Final Clinical Impression(s) / ED Diagnoses Final diagnoses:  COVID-19     @PCDICTATION @    Mannie Pac T, DO 10/12/23 2034

## 2023-10-12 NOTE — ED Triage Notes (Signed)
 Arrives POV with complaints worsening chest pain going into her back with shortness of breath x1 week.

## 2023-10-12 NOTE — Discharge Instructions (Signed)
 You tested positive for COVID-19.  The rest of your workup was overall normal.  I have sent a prescription for an albuterol  inhaler to your pharmacy.  You may use it as needed for shortness of breath.  I have also sent your prescription for Paxlovid , which is a medication that can reduce the severity and duration of COVID symptoms.  Please follow-up with your primary care doctor.  Return to the emergency room if you develop increasing difficulty with your breathing or confusion.

## 2023-12-30 IMAGING — CR DG HAND COMPLETE 3+V*R*
3 series · 3 of 3 positions shown · non-contrast
Comparison: None.

CLINICAL DATA: Skin infection.

EXAM:
RIGHT HAND - COMPLETE 3 VIEW

[x hand pa right]
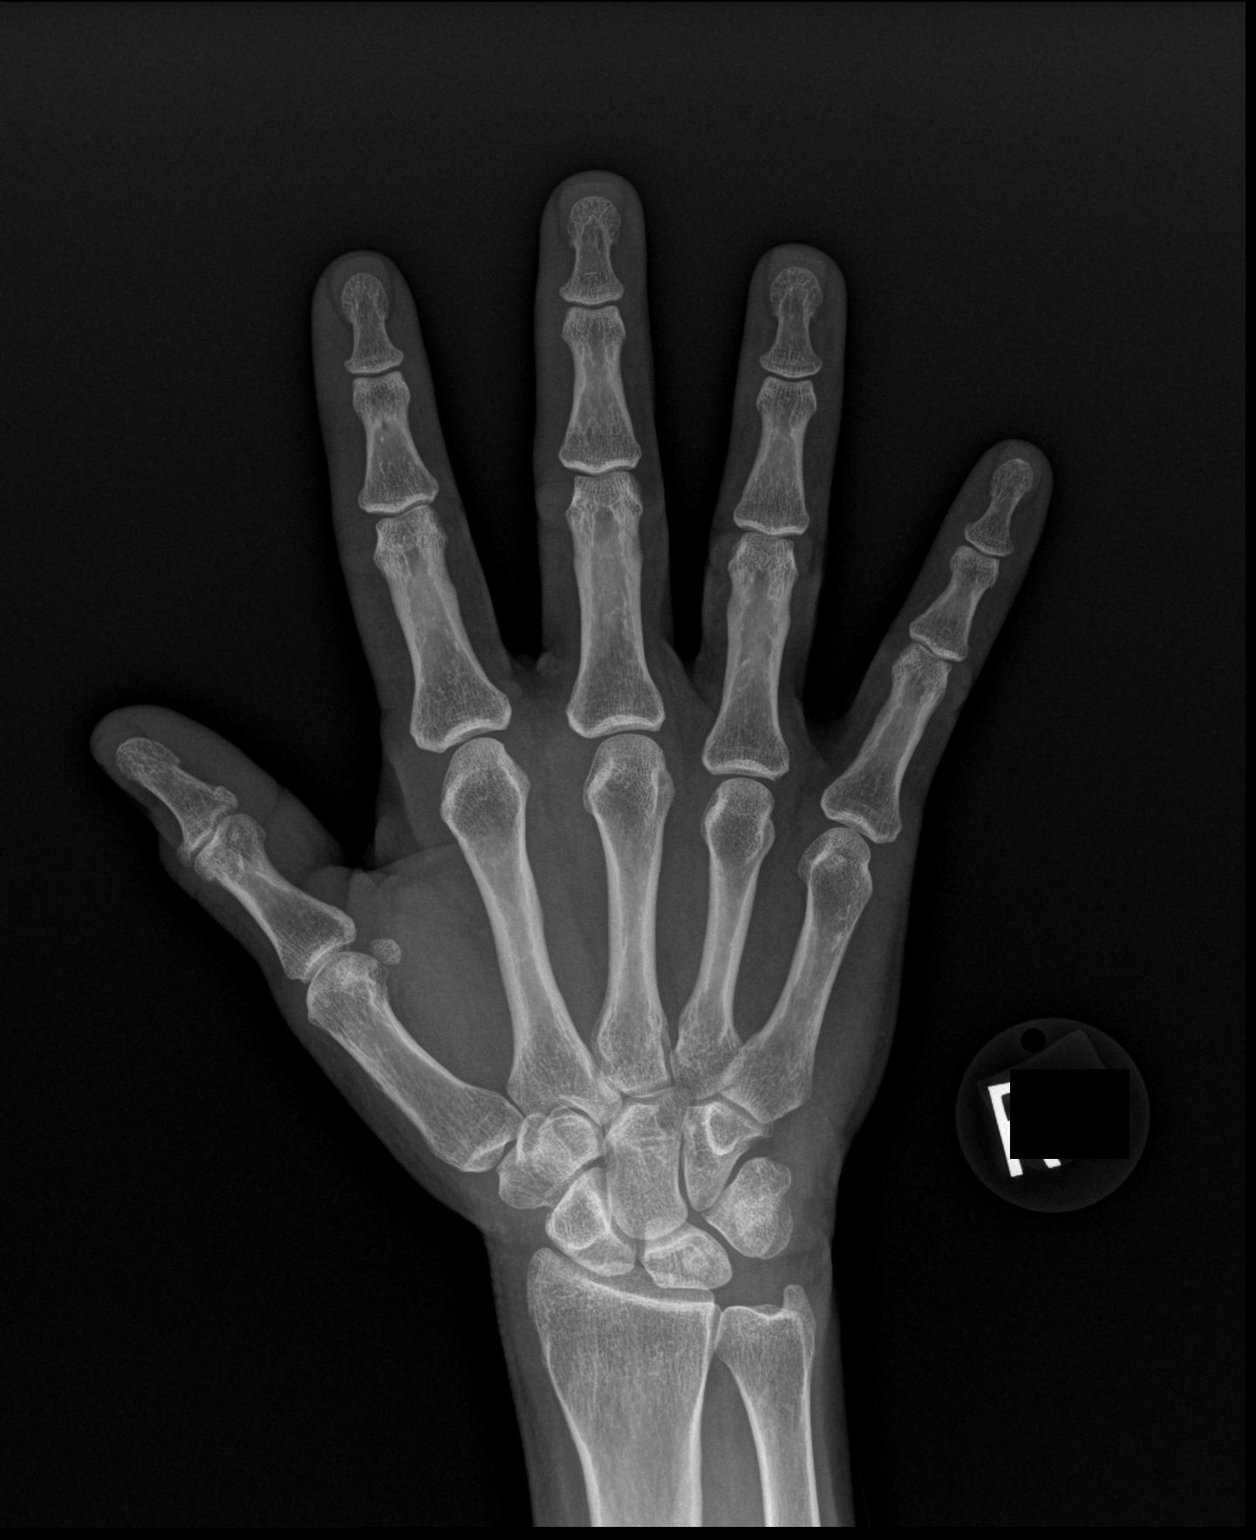

[x hand obl right]
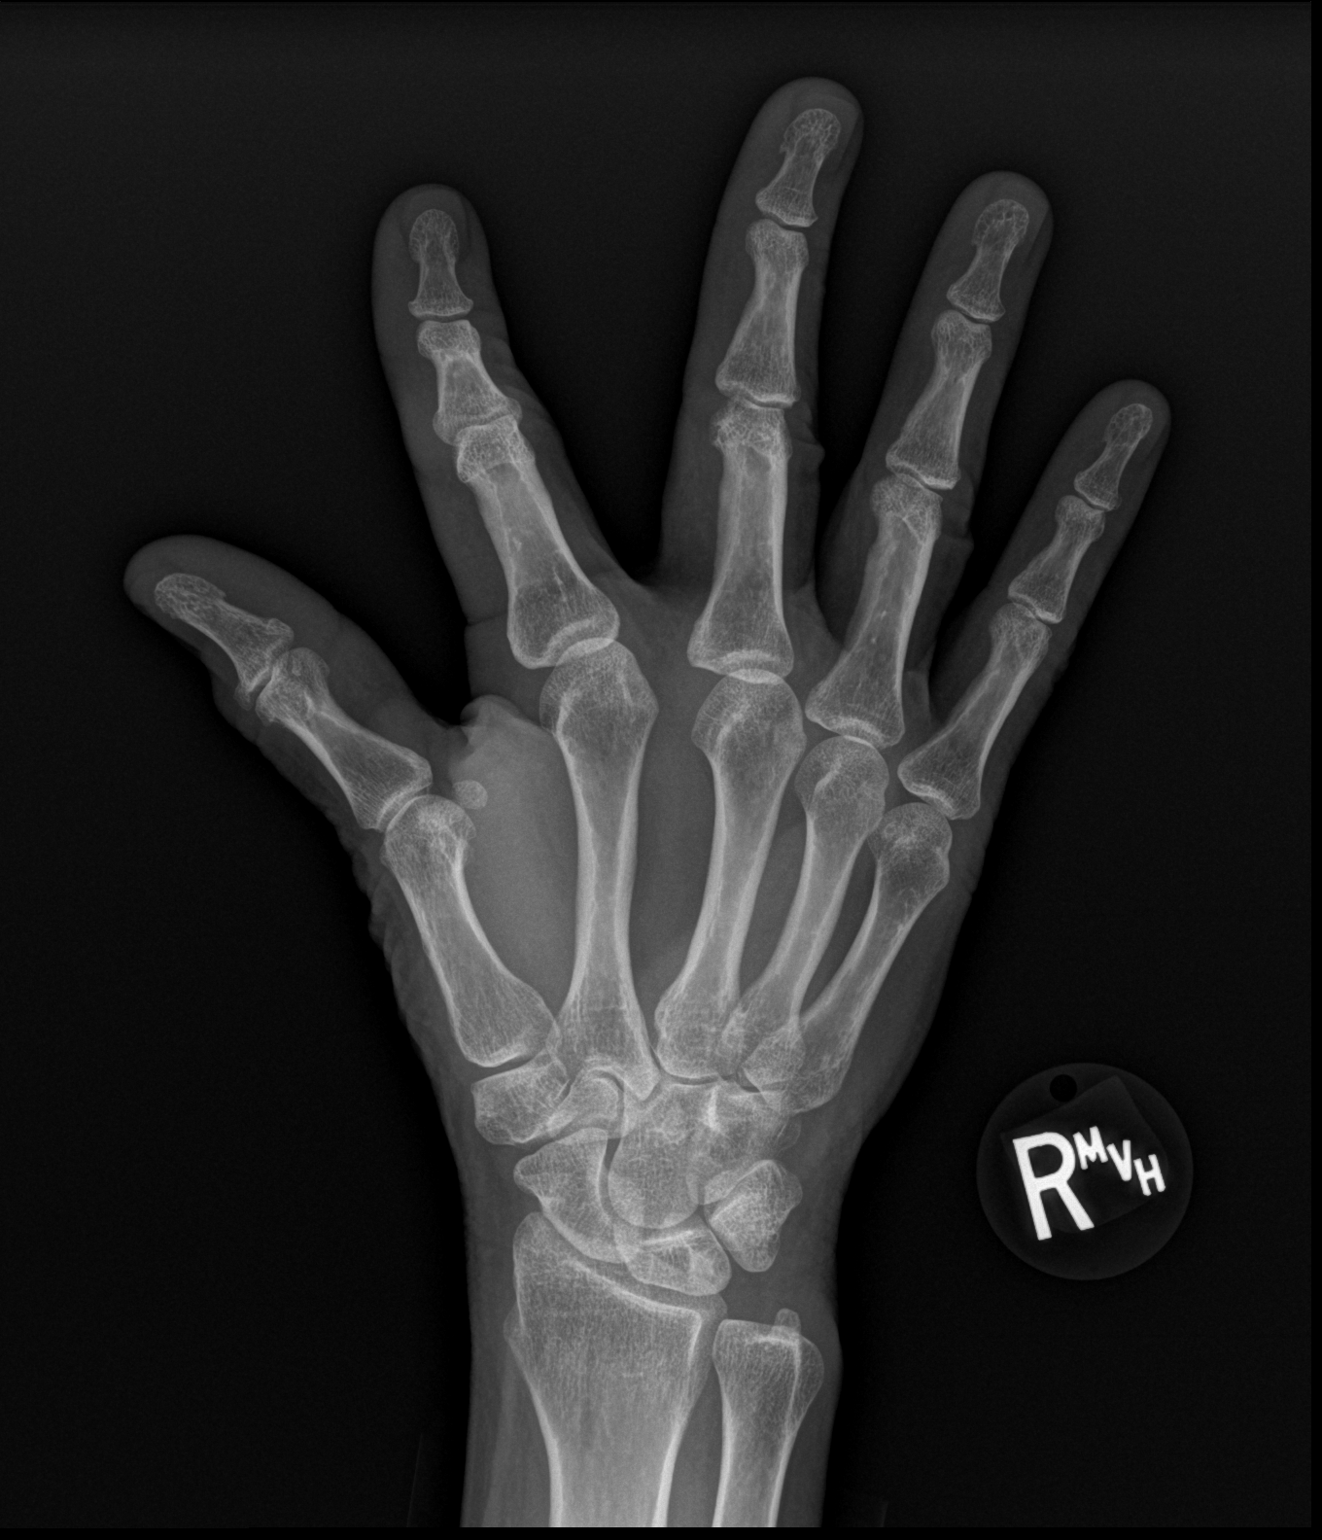

[x hand lat right]
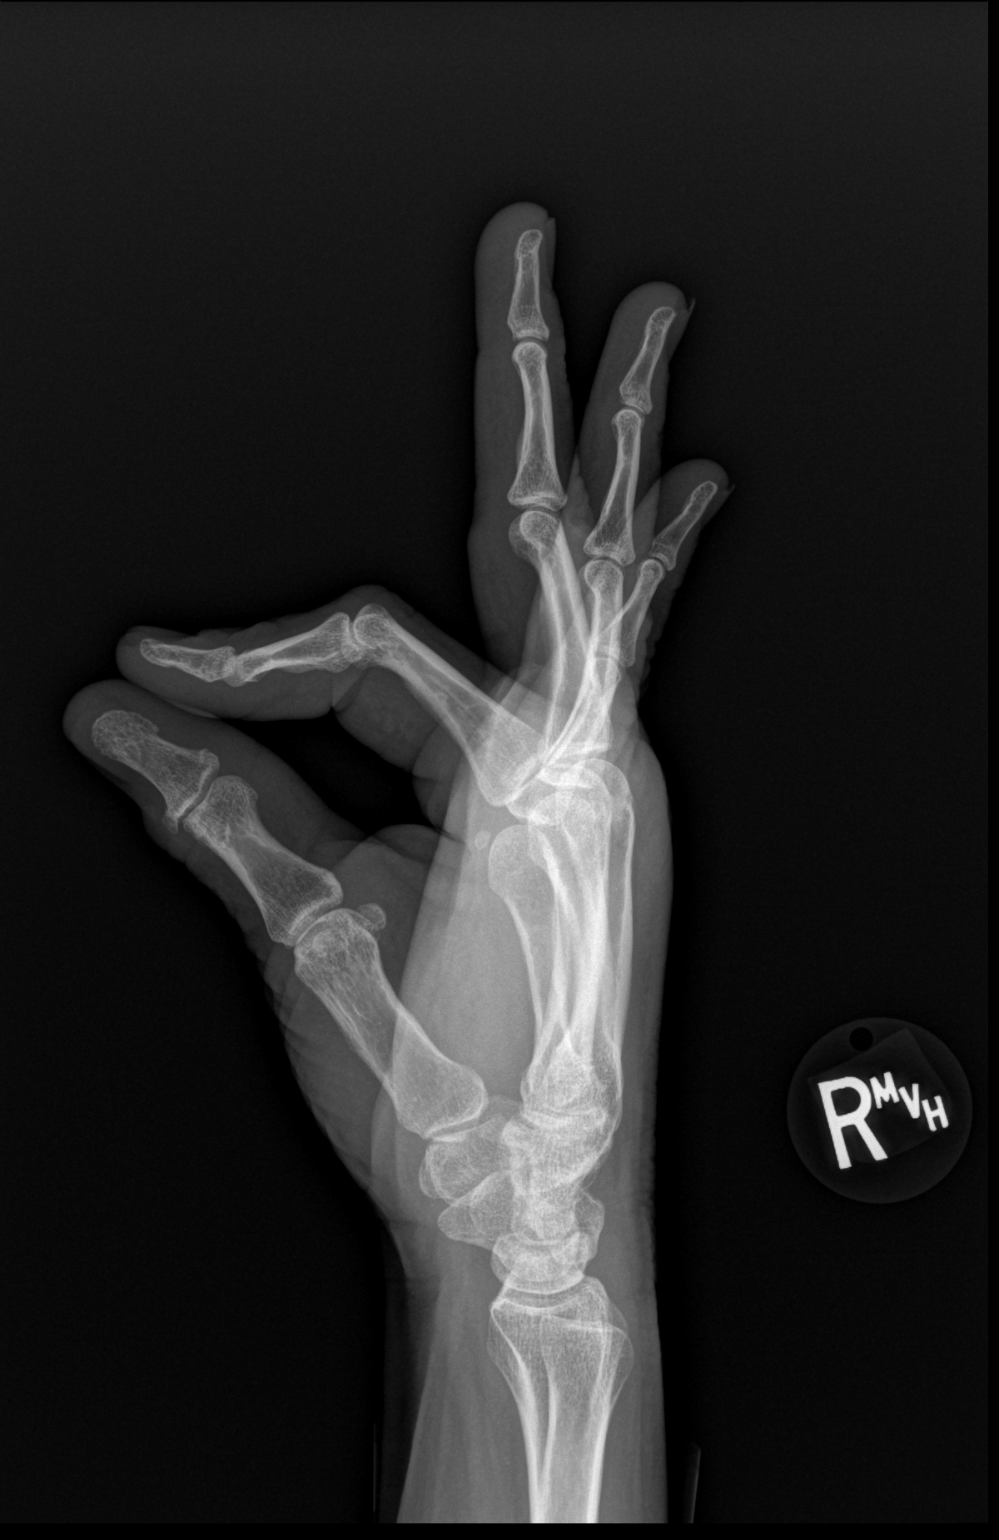

[3 of 3 positions shown; findings below may reference images not displayed]

FINDINGS: There is no evidence of fracture or dislocation. Mild degenerative
changes of the thumb IP joint. Soft tissues are unremarkable.
IMPRESSION: No acute osseous abnormality.

## 2024-01-26 ENCOUNTER — Emergency Department (HOSPITAL_BASED_OUTPATIENT_CLINIC_OR_DEPARTMENT_OTHER)
Admission: EM | Admit: 2024-01-26 | Discharge: 2024-01-26 | Disposition: A | Discharge status: Home / Self Care | Attending: Emergency Medicine | Admitting: Emergency Medicine

## 2024-01-26 ENCOUNTER — Other Ambulatory Visit: Payer: Self-pay

## 2024-01-26 ENCOUNTER — Encounter (HOSPITAL_BASED_OUTPATIENT_CLINIC_OR_DEPARTMENT_OTHER): Payer: Self-pay | Admitting: Emergency Medicine

## 2024-01-26 ENCOUNTER — Emergency Department (HOSPITAL_BASED_OUTPATIENT_CLINIC_OR_DEPARTMENT_OTHER)

## 2024-01-26 DIAGNOSIS — W109XXA Fall (on) (from) unspecified stairs and steps, initial encounter: Secondary | ICD-10-CM | POA: Insufficient documentation

## 2024-01-26 DIAGNOSIS — M79672 Pain in left foot: Secondary | ICD-10-CM | POA: Diagnosis present

## 2024-01-26 DIAGNOSIS — S93602A Unspecified sprain of left foot, initial encounter: Secondary | ICD-10-CM | POA: Insufficient documentation

## 2024-01-26 NOTE — ED Triage Notes (Signed)
  Patient comes in with L foot pain from fall that occurred yesterday.  Patient states she was walking out her front door and fell forwards causing her toes to bend backwards.  Patient has some swelling, and discoloration to L foot.  Has been taking tylenol  and advil  at home for pain.  Pain 9/10, aching/sore.  Last dose of advil  was around 2300.

## 2024-01-26 NOTE — ED Provider Notes (Signed)
 [place] EMERGENCY DEPARTMENT AT [facility]  Provider Note  CSN: [id number] Arrival date & time: 01/26/24 0242  History Chief Complaint  Patient presents with  . Foot Pain    Kristin Hickman is a 53 y.o. female reports she fell on her front steps earlier tonight injuring her L foot and ankle. Not improved with advil  and APAP at home.    Home Medications Prior to Admission medications   Medication Sig Start Date End Date Taking? Authorizing Provider  albuterol  (VENTOLIN  HFA) 108 (90 Base) MCG/ACT inhaler Inhale 1-2 puffs into the lungs every 6 (six) hours as needed for wheezing or shortness of breath. 10/12/23   Mannie Fairy DASEN, DO  buprenorphine (SUBUTEX) 8 MG SUBL SL tablet Place 2.5 tablets under the tongue daily. Patient not taking: Reported on 04/15/2023 12/06/22   [provider]  pantoprazole  (PROTONIX ) 20 MG tablet Take 1 tablet (20 mg total) by mouth daily. 12/11/22 01/10/23  Jerral Meth, MD  sertraline  (ZOLOFT ) 50 MG tablet Take 1 tablet (50 mg total) by mouth daily. 04/15/23   Tanda Bleacher, MD     Allergies    Codeine   Review of Systems   Review of Systems Please see HPI for pertinent positives and negatives  Physical Exam BP (!) 155/108 (BP Location: Right Arm)   Pulse 82   Temp 98.1 F (36.7 C) (Oral)   Resp 20   Ht 5' 7 (1.702 m)   Wt 45.4 kg   SpO2 100%   BMI 15.66 kg/m   Physical Exam Vitals and nursing note reviewed.  HENT:     Head: Normocephalic.     Nose: Nose normal.  Eyes:     Extraocular Movements: Extraocular movements intact.  Pulmonary:     Effort: Pulmonary effort is normal.  Musculoskeletal:        General: Swelling and tenderness (L lateral malleolus and base of 5th metatarsal as well as distal foot) present. Normal range of motion.     Cervical back: Neck supple.  Skin:    Findings: No rash (on exposed skin).  Neurological:     Mental Status: She is alert and oriented to person, place, and time.   Psychiatric:        Mood and Affect: Mood normal.     ED Results / Procedures / Treatments   EKG None  Procedures Procedures  Medications Ordered in the ED Medications - No data to display  Initial Impression and Plan  Patient here for foot and ankle injury. Will send for imagine.   ED Course   Clinical Course as of 01/26/24 0345  [place] Jan 26, 2024  [id number] I personally viewed the images from radiology studies and agree with radiologist interpretation: Xrays are neg for fracture. Will place in cam walker for comfort. Recommend ice, elevation and rest.   [CS]  0344 Patient taking subutex at home. She will continue with OTC analgesia.  [CS]    Clinical Course User Index [CS] Roselyn Carlin NOVAK, MD     MDM Rules/Calculators/A&P Medical Decision Making Problems Addressed: Sprain of left foot, initial encounter: acute illness or injury  Amount and/or Complexity of Data Reviewed Radiology: ordered and independent interpretation performed. Decision-making details documented in ED Course.  Risk Prescription drug management.     Final Clinical Impression(s) / ED Diagnoses Final diagnoses:  Sprain of left foot, initial encounter    Rx / DC Orders ED Discharge Orders     None  Roselyn Carlin NOVAK, MD 01/26/24 [phone]

## 2024-02-06 ENCOUNTER — Emergency Department (HOSPITAL_COMMUNITY)
Admission: EM | Admit: 2024-02-06 | Discharge: 2024-02-06 | Discharge status: Against Medical Advice | Attending: Emergency Medicine | Admitting: Emergency Medicine

## 2024-02-06 ENCOUNTER — Encounter (HOSPITAL_BASED_OUTPATIENT_CLINIC_OR_DEPARTMENT_OTHER): Payer: Self-pay

## 2024-02-06 ENCOUNTER — Other Ambulatory Visit: Payer: Self-pay

## 2024-02-06 ENCOUNTER — Emergency Department (HOSPITAL_BASED_OUTPATIENT_CLINIC_OR_DEPARTMENT_OTHER)
Admission: EM | Admit: 2024-02-06 | Discharge: 2024-02-07 | Disposition: A | Attending: Emergency Medicine | Admitting: Emergency Medicine

## 2024-02-06 ENCOUNTER — Emergency Department (HOSPITAL_BASED_OUTPATIENT_CLINIC_OR_DEPARTMENT_OTHER): Admitting: Radiology

## 2024-02-06 DIAGNOSIS — R0602 Shortness of breath: Secondary | ICD-10-CM | POA: Insufficient documentation

## 2024-02-06 DIAGNOSIS — I10 Essential (primary) hypertension: Secondary | ICD-10-CM | POA: Insufficient documentation

## 2024-02-06 DIAGNOSIS — J439 Emphysema, unspecified: Secondary | ICD-10-CM | POA: Insufficient documentation

## 2024-02-06 DIAGNOSIS — E119 Type 2 diabetes mellitus without complications: Secondary | ICD-10-CM | POA: Insufficient documentation

## 2024-02-06 DIAGNOSIS — J45909 Unspecified asthma, uncomplicated: Secondary | ICD-10-CM | POA: Insufficient documentation

## 2024-02-06 DIAGNOSIS — Z5321 Procedure and treatment not carried out due to patient leaving prior to being seen by health care provider: Secondary | ICD-10-CM | POA: Insufficient documentation

## 2024-02-06 DIAGNOSIS — J449 Chronic obstructive pulmonary disease, unspecified: Secondary | ICD-10-CM | POA: Insufficient documentation

## 2024-02-06 LAB — BASIC METABOLIC PANEL WITH GFR
Anion gap: 9 (ref 5–15)
BUN: 8 mg/dL (ref 6–20)
CO2: 25 mmol/L (ref 22–32)
Calcium: 9.3 mg/dL (ref 8.9–10.3)
Chloride: 104 mmol/L (ref 98–111)
Creatinine, Ser: 0.71 mg/dL (ref 0.44–1.00)
GFR, Estimated: >60 mL/min (ref >60)
Glucose, Bld: 93 mg/dL (ref 70–99)
Potassium: 3.7 mmol/L (ref 3.5–5.1)
Sodium: 138 mmol/L (ref 135–145)

## 2024-02-06 LAB — CBC
HCT: 42.9 % (ref 36.0–46.0)
Hemoglobin: 14.5 g/dL (ref 12.0–15.0)
MCH: 34.1 pg — ABNORMAL HIGH (ref 26.0–34.0)
MCHC: 33.8 g/dL (ref 30.0–36.0)
MCV: 100.9 fL — ABNORMAL HIGH (ref 80.0–100.0)
Platelets: 274 K/uL (ref 150–400)
RBC: 4.25 MIL/uL (ref 3.87–5.11)
RDW: 12.8 % (ref 11.5–15.5)
WBC: 5.9 K/uL (ref 4.0–10.5)
nRBC: 0 % (ref 0.0–0.2)

## 2024-02-06 LAB — D-DIMER, QUANTITATIVE: D-Dimer, Quant: 0.46 ug{FEU}/mL (ref 0.00–0.50)

## 2024-02-06 MED ORDER — PREDNISONE 20 MG PO TABS: 40.0000 mg | ORAL_TABLET | Freq: Every day | ORAL | 0 refills | Status: AC

## 2024-02-06 MED ORDER — METHYLPREDNISOLONE SODIUM SUCC 125 MG IJ SOLR
125.0000 mg | Freq: Once | INTRAMUSCULAR | Status: AC
  Administered 2024-02-07: 125 mg via INTRAVENOUS
  Filled 2024-02-06: qty 2

## 2024-02-06 MED ORDER — DOXYCYCLINE HYCLATE 100 MG PO CAPS: 100.0000 mg | ORAL_CAPSULE | Freq: Two times a day (BID) | ORAL | 0 refills | Status: AC

## 2024-02-06 MED ORDER — NAPROXEN 500 MG PO TABS: 500.0000 mg | ORAL_TABLET | Freq: Two times a day (BID) | ORAL | 0 refills | Status: AC

## 2024-02-06 MED ORDER — KETOROLAC TROMETHAMINE 15 MG/ML IJ SOLN
15.0000 mg | Freq: Once | INTRAMUSCULAR | Status: AC
  Administered 2024-02-06: 15 mg via INTRAVENOUS
  Filled 2024-02-06: qty 1

## 2024-02-06 NOTE — ED Notes (Signed)
 Per pt, she states she is feeling better and is going to leave

## 2024-02-06 NOTE — ED Notes (Signed)
 ED Provider at bedside.

## 2024-02-06 NOTE — ED Triage Notes (Signed)
 Pt BIB [facility] coming from home called out for shortness of breath that began last night and has worsened. Patient describes a burning sensation in her lungs. Endorses drug use. Hx of COPD.    EMS: 106 CBG 104 HR 158/100 100% RA  20 L AC

## 2024-02-06 NOTE — ED Triage Notes (Addendum)
 Pt was at [facility] earlier and was tired of waiting, came here They did blood work and EKG @ [facility] ([facility]) and appears WNLs Pt states she has been SHOB x 2 days and states it feels like her lungs are burning EMS took her to [facility] this afternoon because of a syncopal episode at home.

## 2024-02-06 NOTE — Discharge Instructions (Addendum)
 You were seen today for emphysema with your increased sputum production and shortness of breath, I am sending you home with some antibiotics as well as with some anti-inflammatories to help with the pain. You could try using Tylenol  for additional relief however I am also sending in a short course of steroids to help out with the pain that you have been experiencing.  Please take Naprosyn , 500mg  by mouth twice daily as needed for pain - this in an antiinflammatory medicine (NSAID) and is similar to ibuprofen  - many people feel that it is stronger than ibuprofen  and it is easier to take since it is a smaller pill.  Please use this only for 1 week - if your pain persists, you will need to follow up with your doctor in the office for ongoing guidance and pain control.    Take Tylenol  (acetominophen)  650mg  every 4-6 hours, as needed for pain or fever. Do not take more than 4,000 mg in a 24-hour period. As this may cause liver damage. While this is rare, if you begin to develop yellowing of the skin or eyes, stop taking and return to ER immediately.  Take once a day for the next 4 days.  Noting that you have gotten 1 dose tonight to help with additional pain relief.

## 2024-02-06 NOTE — ED Provider Notes (Signed)
 [place] EMERGENCY DEPARTMENT AT [facility] Provider Note   CSN: [id number] Arrival date & time: 02/06/24  1924     Patient presents with: Shortness of Breath   Kristin Hickman is a 53 y.o. female.  Shortness of Breath Associated symptoms: cough    Patient is a 52 year old female presenting ED today for concerns for pleuritic, burning chest pain starting yesterday accompanying with productive cough and congestion and shortness of breath.  Noted that she did have a syncopal episode today where she felt she could not catch my breath after coming down the stairs noted to have been caught by her friend, and only was passed out for a few seconds.  Did not hit the floor.  Was seen previously at [facility] where she had her labs done but told nursing staff there that she was feeling better and left and told nursing staff here that she left because they were making her wait too long.   Does smoke, and has been continue to smoke despite her shortness of breath and cough that she has experienced last couple days.  PMHx: Addison's disease, HTN, Asthma, DM  Denies fever, headache, vision changes, odynophagia, hemoptysis, abdominal pain, nausea, vomiting, diarrhea, dysuria, hematuria, rashes, diarrhea, melena, hematochezia, lower leg swelling.  Prior to Admission medications   Medication Sig Start Date End Date Taking? Authorizing Provider  doxycycline  (VIBRAMYCIN ) 100 MG capsule Take 1 capsule (100 mg total) by mouth 2 (two) times daily for 5 days. 02/06/24 02/11/24 Yes Cadi Rhinehart S, PA-C  naproxen  (NAPROSYN ) 500 MG tablet Take 1 tablet (500 mg total) by mouth 2 (two) times daily. 02/06/24  Yes Beola Terrall GORMAN, PA-C  predniSONE  (DELTASONE ) 20 MG tablet Take 2 tablets (40 mg total) by mouth daily for 4 days. 02/06/24 02/10/24 Yes Nayla Dias S, PA-C  albuterol  (VENTOLIN  HFA) 108 (90 Base) MCG/ACT inhaler Inhale 1-2 puffs into the lungs every 6 (six) hours as needed for wheezing or  shortness of breath. 10/12/23   Mannie Fairy DASEN, DO  buprenorphine (SUBUTEX) 8 MG SUBL SL tablet Place 2.5 tablets under the tongue daily. Patient not taking: Reported on 04/15/2023 12/06/22   [provider]  pantoprazole  (PROTONIX ) 20 MG tablet Take 1 tablet (20 mg total) by mouth daily. 12/11/22 01/10/23  Jerral Meth, MD  sertraline  (ZOLOFT ) 50 MG tablet Take 1 tablet (50 mg total) by mouth daily. 04/15/23   Tanda Bleacher, MD    Allergies: Codeine    Review of Systems  HENT:  Positive for congestion.   Respiratory:  Positive for cough and shortness of breath.   All other systems reviewed and are negative.   Updated Vital Signs BP 124/81   Pulse 66   Temp 98 F (36.7 C)   Resp 16   Ht 5' 7 (1.702 m)   Wt 45 kg   SpO2 97%   BMI 15.54 kg/m   Physical Exam Vitals and nursing note reviewed.  Constitutional:      General: She is not in acute distress.    Appearance: Normal appearance. She is not ill-appearing or diaphoretic.  HENT:     Head: Normocephalic and atraumatic.  Eyes:     General: No scleral icterus.       Right eye: No discharge.        Left eye: No discharge.     Extraocular Movements: Extraocular movements intact.     Conjunctiva/sclera: Conjunctivae normal.  Cardiovascular:     Rate and Rhythm: Normal rate and  regular rhythm.     Pulses: Normal pulses.     Heart sounds: Normal heart sounds. No murmur heard.    No friction rub. No gallop.  Pulmonary:     Effort: Pulmonary effort is normal. No respiratory distress.     Breath sounds: No stridor. No wheezing, rhonchi or rales.  Chest:     Chest wall: No tenderness.  Abdominal:     General: Abdomen is flat. There is no distension.     Palpations: Abdomen is soft.     Tenderness: There is no abdominal tenderness. There is no right CVA tenderness, left CVA tenderness, guarding or rebound.  Musculoskeletal:        General: No swelling, deformity or signs of injury.     Cervical back: Normal  range of motion. No rigidity.     Right lower leg: No edema.     Left lower leg: No edema.  Skin:    General: Skin is warm and dry.     Findings: No bruising, erythema or lesion.  Neurological:     General: No focal deficit present.     Mental Status: She is alert and oriented to person, place, and time. Mental status is at baseline.     Sensory: No sensory deficit.     Motor: No weakness.  Psychiatric:     Comments: No to be tearful halfway through examination     (all labs ordered are listed, but only abnormal results are displayed) Labs Reviewed  D-DIMER, QUANTITATIVE  TROPONIN T, HIGH SENSITIVITY  TROPONIN T, HIGH SENSITIVITY    EKG: EKG Interpretation Date/Time:  Thursday February 06 2024 19:45:48 EST Ventricular Rate:  80 PR Interval:  168 QRS Duration:  88 QT Interval:  356 QTC Calculation: 410 R Axis:   90  Text Interpretation: Normal sinus rhythm Rightward axis Borderline ECG When compared with ECG of 06-Feb-2024 15:59, QT has shortened Confirmed by Ellouise Fine ([id number]) on 02/06/2024 9:33:40 PM  Radiology: [facility] Chest 2 View Result Date: 02/06/2024 CLINICAL DATA:  SHOB EXAM: CHEST - 2 VIEW COMPARISON:  04/25/2023 FINDINGS: Hyperexpanded lungs and flattening of the diaphragms, suggesting emphysema. No focal airspace consolidation, pleural effusion, or pneumothorax. No cardiomegaly.No acute fracture or destructive lesion. Multilevel thoracic osteophytosis. IMPRESSION: Findings suggestive of emphysema.  No pneumonia or pulmonary edema. Electronically Signed   By: Rogelia Myers M.D.   On: 02/06/2024 20:24   Procedures   Medications Ordered in the ED  albuterol  (VENTOLIN  HFA) 108 (90 Base) MCG/ACT inhaler 2 puff (has no administration in time range)  ketorolac  (TORADOL ) 15 MG/ML injection 15 mg (15 mg Intravenous Given 02/06/24 2256)  methylPREDNISolone  sodium succinate (SOLU-MEDROL ) 125 mg/2 mL injection 125 mg (125 mg Intravenous Given 02/07/24 0006)                                 Medical Decision Making Amount and/or Complexity of Data Reviewed Labs: ordered. Radiology: ordered.  Risk Prescription drug management.   This patient is a 53 year old female who presents to the ED for concern of burning, pleuritic central sternal chest pain with shortness of breath accompanied with 1 possible syncopal episode today with symptoms starting yesterday.  Simaan was going today and left after reported that she had felt better to nursing staff but was not seen by providers.  Had lab work there which showed normal CBC and BMP.  On physical exam, patient is in no acute distress,  afebrile, alert and orient x 4, speaking in full sentences, nontachypneic, nontachycardic.  Noted to be tearful, reporting that her burning chest pain was the cause of her tears.  LCTAB, RRR, no murmur, no lower leg edema, no rashes, no abdominal tenderness, oropharynx clear without signs of infection.  Unremarkable exam otherwise.  Patient overall well-appearing despite being tearful.  Will get D-dimer and troponins due to patient's syncopal episode.  Suspecting likely emphysema and costochondritis as cause of patient's pain.  Will provide some Toradol  and reevaluate.  On reevaluation, patient noted that her pain is significantly improved.  Still oxygenating well.  Will provide prednisone  here as well as sending her home with a short course prednisone  and doxycycline  with her noting to have emphysema noted on her x-ray today with history of smoking and increased sputum reduction and shortness of breath.  Additionally we will send home with albuterol  inhaler and have her need to follow with PCP for further management and monitoring.  Patient vital signs have remained stable throughout the course of patient's time in the ED. Low suspicion for any other emergent pathology at this time. I believe this patient is safe to be discharged. Provided strict return to ER precautions. Patient expressed  agreement and understanding of plan. All questions were answered.  Differential diagnoses prior to evaluation: The emergent differential diagnosis includes, but is not limited to, CHF, pericardial effusion/tamponade, arrhythmias, ACS, COPD, asthma, bronchitis, pneumonia, pneumothorax, PE, anemia. This is not an exhaustive differential.   Past Medical History / Co-morbidities / Social History: Addison's disease, HTN, Asthma, DM  Additional history: Chart reviewed. Pertinent results include:   Had lab work done today at [facility] told tech that she was feeling better and is leaving.  CBC unremarkable, BMP unremarkable  Seen previously in the emergency department 01/26/24 for sprain of left foot.  Lab Tests/Imaging studies: I personally interpreted labs/imaging and the pertinent results include:   Chest x-ray shows finding suggestive of edema but no signs of pneumonia or edema I agree with the radiologist interpretation. Troponin and D-dimer were unremarkable.  Lab work from [facility] was also unremarkable.   Cardiac monitoring: EKG obtained and interpreted by myself and attending physician which shows: Normal sinus rhythm   Medications: I ordered medication including prednisone , albuterol , doxycyline, .  I have reviewed the patients home medicines and have made adjustments as needed.  Critical Interventions: None  Social Determinants of Health: Has good follow-up with PCP  Disposition: After consideration of the diagnostic results and the patients response to treatment, I feel that the patient would benefit from discharge and treatment as above.   emergency department workup does not suggest an emergent condition requiring admission or immediate intervention beyond what has been performed at this time. The plan is: Follow-up with PCP, treat for COPD exacerbation. The patient is safe for discharge and has been instructed to return immediately for worsening symptoms, change in  symptoms or any other concerns.   Final diagnoses:  Emphysema lung [facility])    ED Discharge Orders          Ordered    doxycycline  (VIBRAMYCIN ) 100 MG capsule  2 times daily        02/06/24 2351    naproxen  (NAPROSYN ) 500 MG tablet  2 times daily        02/06/24 2351    predniSONE  (DELTASONE ) 20 MG tablet  Daily        02/06/24 2353  Charidy Cappelletti S, PA-C 02/07/24 0008    Kingsley, Victoria K, DO 02/07/24 1459

## 2024-02-07 ENCOUNTER — Ambulatory Visit: Payer: Self-pay

## 2024-02-07 LAB — TROPONIN T, HIGH SENSITIVITY: Troponin T High Sensitivity: <15 ng/L (ref 0–19)

## 2024-02-07 MED ORDER — ALBUTEROL SULFATE HFA 108 (90 BASE) MCG/ACT IN AERS
2.0000 | INHALATION_SPRAY | Freq: Once | RESPIRATORY_TRACT | Status: AC
  Administered 2024-02-07: 2 via RESPIRATORY_TRACT
  Filled 2024-02-07: qty 6.7

## 2024-02-07 NOTE — ED Notes (Signed)
 Reviewed discharge instructions, medications, and home care with pt. Pt verbalized understanding and had no further questions. Pt exited ED without complications.

## 2024-02-07 NOTE — ED Provider Notes (Incomplete)
 [place] EMERGENCY DEPARTMENT AT [facility] Provider Note   CSN: [id number] Arrival date & time: 02/06/24  1924     Patient presents with: Shortness of Breath   Kristin Hickman is a 53 y.o. female.  Shortness of Breath Associated symptoms: cough    Patient is a 53 year old female presenting ED today for concerns for pleuritic, burning chest pain starting yesterday accompanying with productive cough and congestion and shortness of breath.  Noted that she did have a syncopal episode today where she felt she could not catch my breath after coming down the stairs noted to have a minimal downtime of a few seconds.    Was seen previously at [facility] where she had her labs done but told nursing staff there that she was feeling better and left and told nursing staff here that she left because they were making her wait too long.   Does smoke, and has been continue to smoke despite her shortness of breath and cough that she has experienced last couple days.  PMHx: Addison's disease, HTN, Asthma, DM  Denies fever, headache, vision changes, odynophagia, hemoptysis, abdominal pain, nausea, vomiting, diarrhea, dysuria, hematuria, rashes, diarrhea, melena, hematochezia, lower leg swelling.  Prior to Admission medications   Medication Sig Start Date End Date Taking? Authorizing Provider  albuterol  (VENTOLIN  HFA) 108 (90 Base) MCG/ACT inhaler Inhale 1-2 puffs into the lungs every 6 (six) hours as needed for wheezing or shortness of breath. 10/12/23   Mannie Fairy DASEN, DO  buprenorphine (SUBUTEX) 8 MG SUBL SL tablet Place 2.5 tablets under the tongue daily. Patient not taking: Reported on 04/15/2023 12/06/22   [provider]  pantoprazole  (PROTONIX ) 20 MG tablet Take 1 tablet (20 mg total) by mouth daily. 12/11/22 01/10/23  Jerral Meth, MD  sertraline  (ZOLOFT ) 50 MG tablet Take 1 tablet (50 mg total) by mouth daily. 04/15/23   Tanda Bleacher, MD    Allergies: Codeine     Review of Systems  HENT:  Positive for congestion.   Respiratory:  Positive for cough and shortness of breath.   All other systems reviewed and are negative.   Updated Vital Signs BP 107/83 (BP Location: Right Arm)   Pulse 80   Temp 98 F (36.7 C)   Resp 20   Ht 5' 7 (1.702 m)   Wt 45 kg   SpO2 100%   BMI 15.54 kg/m   Physical Exam Vitals and nursing note reviewed.  Constitutional:      General: She is not in acute distress.    Appearance: Normal appearance. She is not ill-appearing or diaphoretic.  HENT:     Head: Normocephalic and atraumatic.  Eyes:     General: No scleral icterus.       Right eye: No discharge.        Left eye: No discharge.     Extraocular Movements: Extraocular movements intact.     Conjunctiva/sclera: Conjunctivae normal.  Cardiovascular:     Rate and Rhythm: Normal rate and regular rhythm.     Pulses: Normal pulses.     Heart sounds: Normal heart sounds. No murmur heard.    No friction rub. No gallop.  Pulmonary:     Effort: Pulmonary effort is normal. No respiratory distress.     Breath sounds: No stridor. No wheezing, rhonchi or rales.  Chest:     Chest wall: No tenderness.  Abdominal:     General: Abdomen is flat. There is no distension.     Palpations:  Abdomen is soft.     Tenderness: There is no abdominal tenderness. There is no right CVA tenderness, left CVA tenderness, guarding or rebound.  Musculoskeletal:        General: No swelling, deformity or signs of injury.     Cervical back: Normal range of motion. No rigidity.     Right lower leg: No edema.     Left lower leg: No edema.  Skin:    General: Skin is warm and dry.     Findings: No bruising, erythema or lesion.  Neurological:     General: No focal deficit present.     Mental Status: She is alert and oriented to person, place, and time. Mental status is at baseline.     Sensory: No sensory deficit.     Motor: No weakness.  Psychiatric:     Comments: No to be tearful  halfway through examination     (all labs ordered are listed, but only abnormal results are displayed) Labs Reviewed - No data to display  EKG: None  Radiology: DG Chest 2 View Result Date: 02/06/2024 CLINICAL DATA:  SHOB EXAM: CHEST - 2 VIEW COMPARISON:  04/25/2023 FINDINGS: Hyperexpanded lungs and flattening of the diaphragms, suggesting emphysema. No focal airspace consolidation, pleural effusion, or pneumothorax. No cardiomegaly.No acute fracture or destructive lesion. Multilevel thoracic osteophytosis. IMPRESSION: Findings suggestive of emphysema.  No pneumonia or pulmonary edema. Electronically Signed   By: Rogelia Myers M.D.   On: 02/06/2024 20:24   Procedures   Medications Ordered in the ED - No data to display    {Click here for ABCD2, HEART and other calculators REFRESH Note before signing:1}                              Medical Decision Making Amount and/or Complexity of Data Reviewed Labs: ordered. Radiology: ordered.  Risk Prescription drug management.   This patient is a 53 year old female who presents to the ED for concern of burning, pleuritic central sternal chest pain with shortness of breath accompanied with 1 single episode today with symptoms starting yesterday.  Simaan was going today and left after reported that she had felt better to nursing staff but was not seen by providers.  Had lab work there which showed normal CBC and BMP.  On physical exam, patient is in no acute distress, afebrile, alert and orient x 4, speaking in full sentences, nontachypneic, nontachycardic.  Noted to be tearful, reporting that her burning chest pain was the cause of her tears.  LCTAB, RRR, no murmur, no lower leg edema, no rashes, no abdominal tenderness, oropharynx clear without signs of infection.  Unremarkable exam otherwise.  Patient overall well-appearing despite being tearful.  Will get D-dimer and troponins due to patient's syncopal episode.  Suspecting likely  emphysema and costochondritis as cause of patient's pain.  Will provide some Toradol  and reevaluate.  Differential diagnoses prior to evaluation: The emergent differential diagnosis includes, but is not limited to, CHF, pericardial effusion/tamponade, arrhythmias, ACS, COPD, asthma, bronchitis, pneumonia, pneumothorax, PE, anemia. This is not an exhaustive differential.   Past Medical History / Co-morbidities / Social History: Addison's disease, HTN, Asthma, DM  Additional history: Chart reviewed. Pertinent results include:   Had lab work done today at [facility] told tech that she was feeling better and is leaving.  CBC unremarkable, BMP unremarkable  Seen previously in the emergency department 01/26/24 for sprain of left foot.  Lab Tests/Imaging studies:  I personally interpreted labs/imaging and the pertinent results include:   Chest x-ray shows finding suggestive of edema but no signs of pneumonia or edema I agree with the radiologist interpretation.  Cardiac monitoring: EKG obtained and interpreted by myself and attending physician which shows: Normal sinus rhythm   Medications: I ordered medication including prednisone , albuterol , doxycyline, .  I have reviewed the patients home medicines and have made adjustments as needed.  Critical Interventions: None  Social Determinants of Health: Has good follow-up with PCP  Disposition: After consideration of the diagnostic results and the patients response to treatment, I feel that the patient would benefit from ***.   ***emergency department workup does not suggest an emergent condition requiring admission or immediate intervention beyond what has been performed at this time. The plan is: ***. The patient is safe for discharge and has been instructed to return immediately for worsening symptoms, change in symptoms or any other concerns.   Final diagnoses:  None    ED Discharge Orders     None

## 2024-02-07 NOTE — Telephone Encounter (Signed)
 Pt scheduled

## 2024-02-07 NOTE — Telephone Encounter (Addendum)
 FYI Only or Action Required?: Action required by provider: request for appointment. Refused ED, needing ED f/u appt. Soonest appt on 12/16.  Patient was last seen in primary care on 04/15/2023 by Tanda Bleacher, MD.  Called Nurse Triage reporting Shortness of Breath and Pain.  Symptoms began several days ago.  Interventions attempted: Prescription medications: doxycycline , naproxen , prednisone  and albuterol  inhaler.  Symptoms are: unchanged.  Triage Disposition: Go to ED Now (Notify PCP)  Patient/caregiver understands and will follow disposition?: No, refuses disposition   Copied from CRM [id number]. Topic: Clinical - Red Word Triage >> Feb 07, 2024 12:56 PM Antwanette L wrote: Red Word that prompted transfer to Nurse Triage:  Pt is still havin g burning in chest and sob. Pt needs to schedule hospital follow up. Patient was seen on 12/4  at [facility] ED at [facility] . Reason for Disposition  [1] MODERATE difficulty breathing (e.g., speaks in phrases, SOB even at rest, pulse 100-120) AND [2] NEW-onset or WORSE than normal    Already went to ED yesterday, needing ED f/u visit  Answer Assessment - Initial Assessment Questions Pt went to ED yesterday with severe 10/10 burning pain in chest with episodes of moderate to severe SOB x3 days. Mild SOB at rest. Cardiac workup negative, negative for blood clot. Dx with emphysema, prescribed doxycycline , naproxen , prednisone  and albuterol  inhaler. Pt just took all meds for first time about 30 minutes ago. Calling to schedule ED f/u appt. Reports symptoms have not changed or worsened since leaving ED. 8-10/10 burning constant chest pain across full chest and back, worse when breathing in. Mild SOB at rest with random episodes severe SOB. Reports current mild SOB, breathing sounds somewhat labored over the phone. Pt needing to pause intermittently between words. Called [facility] and spoke with CMA Donna if pt could be seen today. Advised ED d/t persistent severe  SOB. Pt declines ED. Called [facility] and spoke with Fronda to notify of ED refusal and pt needing ED f/u visit. Advised pt ED or 911 for worsening symptoms, pt agreeable to do so. Pt states the ED told her there's nothing more that could be done for her after workup. Sending message to office regarding refusal and need for ED f/u.   1. RESPIRATORY STATUS: Describe your breathing? (e.g., wheezing, shortness of breath, unable to speak, severe coughing)      Episodes of feeling like she can't get enough air in with rapid breathing  2. ONSET: When did this breathing problem begin?      3 days ago  3. PATTERN Does the difficult breathing come and go, or has it been constant since it started?      Fluctuates between mild and severe  4. SEVERITY: How bad is your breathing? (e.g., mild, moderate, severe)      Fluctuates between mild and severe SOB  5. RECURRENT SYMPTOM: Have you had difficulty breathing before? If Yes, ask: When was the last time? and What happened that time?      Denies  6. CARDIAC HISTORY: Do you have any history of heart disease? (e.g., heart attack, angina, bypass surgery, angioplasty)      Htn  7. LUNG HISTORY: Do you have any history of lung disease?  (e.g., pulmonary embolus, asthma, emphysema)     Emphysema as of yesterday   8. CAUSE: What do you think is causing the breathing problem?      Dx with emphysema at ED yesterday   9. OTHER SYMPTOMS: Do you have any other  symptoms? (e.g., chest pain, cough, dizziness, fever, runny nose)     8-10/10. Burning chest pain when breathing in, entire chest from middle chest to back.  10. O2 SATURATION MONITOR:  Do you use an oxygen saturation monitor (pulse oximeter) at home? If Yes, ask: What is your reading (oxygen level) today? What is your usual oxygen saturation reading? (e.g., 95%)       Does not wear O2. 97% in the ED yesterday  11. PREGNANCY: Is there any chance you are pregnant? When was your  last menstrual period?       Denies  12. TRAVEL: Have you traveled out of the country in the last month? (e.g., travel history, exposures)       Denies  Protocols used: Breathing Difficulty-A-AH

## 2024-02-18 ENCOUNTER — Telehealth: Payer: Self-pay | Admitting: Family Medicine

## 2024-02-18 ENCOUNTER — Inpatient Hospital Stay: Admitting: Family Medicine

## 2024-02-18 NOTE — Telephone Encounter (Signed)
 Called patient about missed on 12/16. I was not able to leave a message because the voicemail is full.
